# Patient Record
Sex: Female | Born: 1990 | Race: White | Hispanic: No | Marital: Single
Health system: Midwestern US, Academic
[De-identification: ages and names within clinical notes are randomized; demographics above are authoritative.]

---

## 2017-05-10 ENCOUNTER — Encounter: Admit: 2017-05-10 | Discharge: 2017-05-11 | Payer: MEDICARE | Primary: Family

## 2017-05-10 DIAGNOSIS — R69 Illness, unspecified: Principal | ICD-10-CM

## 2017-05-31 ENCOUNTER — Encounter: Admit: 2017-05-31 | Discharge: 2017-05-31 | Payer: MEDICARE | Primary: Family

## 2017-06-01 MED ORDER — TIZANIDINE 4 MG PO TAB
4 mg | ORAL_TABLET | Freq: Every evening | ORAL | 0 refills | Status: AC | PRN
Start: 2017-06-01 — End: 2017-06-06

## 2017-06-01 NOTE — Telephone Encounter
Refill request for Tizanidine 4 mg po qhs prn   Date of last refill per pharmacy  02/2017.   Last office visit 06/01/2017.   Follow up appointment 06/01/2017.    Refill per physician approval.    Patient will need to make/keep appointment prior to any further refills. Otherwise, will need to transfer rx to another provider.

## 2017-06-06 ENCOUNTER — Encounter: Admit: 2017-06-06 | Discharge: 2017-06-06 | Payer: MEDICARE | Primary: Family

## 2017-06-06 ENCOUNTER — Ambulatory Visit: Admit: 2017-06-06 | Discharge: 2017-06-07 | Primary: Family

## 2017-06-06 DIAGNOSIS — F909 Attention-deficit hyperactivity disorder, unspecified type: Principal | ICD-10-CM

## 2017-06-06 DIAGNOSIS — G47411 Narcolepsy with cataplexy: ICD-10-CM

## 2017-06-06 DIAGNOSIS — G9389 Other specified disorders of brain: ICD-10-CM

## 2017-06-06 DIAGNOSIS — K219 Gastro-esophageal reflux disease without esophagitis: ICD-10-CM

## 2017-06-06 DIAGNOSIS — F419 Anxiety disorder, unspecified: ICD-10-CM

## 2017-06-06 DIAGNOSIS — E039 Hypothyroidism, unspecified: ICD-10-CM

## 2017-06-06 DIAGNOSIS — F319 Bipolar disorder, unspecified: ICD-10-CM

## 2017-06-06 DIAGNOSIS — E282 Polycystic ovarian syndrome: ICD-10-CM

## 2017-06-06 DIAGNOSIS — E079 Disorder of thyroid, unspecified: ICD-10-CM

## 2017-06-06 DIAGNOSIS — R9089 Other abnormal findings on diagnostic imaging of central nervous system: Principal | ICD-10-CM

## 2017-06-06 DIAGNOSIS — G500 Trigeminal neuralgia: ICD-10-CM

## 2017-06-06 DIAGNOSIS — J45909 Unspecified asthma, uncomplicated: ICD-10-CM

## 2017-06-06 DIAGNOSIS — F329 Major depressive disorder, single episode, unspecified: ICD-10-CM

## 2017-06-06 NOTE — Progress Notes
Date of Service: 06/06/2017    Subjective:             Julie Bright is a 26 y.o. female.    History of Present Illness    Julie Bright is referred for evaluation of a mass involving the left clinoid process.    She has a hisory significant for Anxiety Disorder, Migraines, Depression, Fibromyalgia, Trigeminal neuralgia, ADHD and Borderline Personality Disorder.???  Patient reports that she began experiencing localized left hand tremors beginning October 2017 which have progressively worsened to full body tremors including all 4 extremities since April 2018. Patient's mother reports that during initial work-up for the full body tremors it was believed that the tremors were due to a toxic level of Tegatrol (previously prescribed for trigeminal neuralgia). The patient's Tegatrol was discontinued and her full body tremor persisted - which is the reason for obtaining the MRI of the brain. The patient also reports increasing frequency and intensity of migraines that are constant, which she rates 10/10 pain. Patient reports taking Tylenol and Dilaudid for her headaches without any relief. Patient reports migraines associated with photophobia, phonophobia, nausea/emesis.   ???  Patient reports going to the ED 06/01/2017 due to intractable migraines and associated left-sided facial droop. The patient's mother reports that the left-sided facial droop resolved with administration of pain medication and resolution of migraine. Patient denies any prior episodes of weakness associated with migraines. Patient has also reported progressive worsening of memory since October 2017.   ???  The MRI was done in OSH. The report mentions a mass involving the clinoid process consistent with a meningioma.  I personally reviewed the images. I disagree with the radiology report. The right clinoid process appears well aerated, the left one has fatty bony marrow. Both appears as normal variants to me.         Review of Systems Constitutional: Positive for fatigue.        Daytime somnolence   Eyes: Positive for visual disturbance.   Respiratory: Positive for shortness of breath.    Musculoskeletal: Positive for arthralgias, back pain, gait problem, myalgias and neck pain.   Allergic/Immunologic: Positive for environmental allergies.   Neurological: Positive for dizziness, numbness and headaches.        Memory problems     Psychiatric/Behavioral: Positive for decreased concentration, dysphoric mood and sleep disturbance. The patient is nervous/anxious.    All other systems reviewed and are negative.        Objective:         ??? albuterol (PROAIR HFA) 90 mcg/actuation inhaler Inhale 2 Puffs by mouth into the lungs four times daily as needed for Wheezing or Shortness of Breath. Shake well before use.   ??? carboxymethylcellulose sodium (REFRESH LIQUIGEL OP) Place 1-2 drops into or around eye(s) at bedtime as needed.   ??? cyanocobalamin (VITAMIN B-12, RUBRAMIN) 1,000 mcg/mL injection Inject 1 mL into the muscle every 30 days.   ??? desloratadine(+) (CLARINEX) 5 mg tablet Take 5 mg by mouth daily.   ??? ergocalciferol (VITAMIN D-2) 50,000 unit capsule Take 1 capsule by mouth every 7 days.   ??? fluticasone (FLONASE) 50 mcg/actuation nasal spray Apply  to each nostril as directed daily. Shake bottle gently before using.   ??? hydrOXYzine pamoate (VISTARIL) 50 mg capsule Take 50 mg by mouth three times daily as needed for Itching.   ??? levothyroxine (SYNTHROID) 88 mcg tablet Take 88 mcg by mouth daily.   ??? meclizine (ANTIVERT) 25 mg tablet Take 25 mg by  mouth three times daily as needed.   ??? medroxyprogesterone (DEPO-PROVERA) 400 mg/mL injection Inject 400 mg into the muscle every 90 days.   ??? pregabalin (LYRICA) 75 mg capsule 75mg  po qam and 150mg  po qhs  Indications: FIBROMYALGIA   ??? tiotropium bromide (SPIRIVA RESPIMAT) 2.5 mcg/actuation inhaler Inhale 2 puffs by mouth into the lungs daily.         Physical Exam Constitutional: She is oriented to person, place, and time. She appears well-developed and well-nourished.   HENT:   Head: Normocephalic and atraumatic.   Eyes: Conjunctivae and EOM are normal. Pupils are equal, round, and reactive to light.   Neck: Neck supple.   Cardiovascular: Normal rate.    Pulmonary/Chest: Effort normal. No respiratory distress.   Abdominal: Soft. She exhibits no distension.   Neurological: She is alert and oriented to person, place, and time. She has normal strength. No cranial nerve deficit. Gait normal.   Skin: Skin is warm and dry.   Psychiatric: She has a normal mood and affect.            Assessment and Plan:    Julie Bright is referred for evaluation of a mass involving the left clinoid process.  The MRI was done in OSH. The report mentions a mass involving the clinoid process consistent with a meningioma.  I personally reviewed the images. I disagree with the radiology report. The right clinoid process appears well aerated, the left one has fatty bony marrow. Both appears as normal variants to me.  In order to confirm whether she has a tumor or not, I will order a thin-cut CT scan and MRI (skull base protocol).  I will see her back after that to discuss the results.    Regarding her symptoms, I clearly explained to her that the symptoms are not related to this presumed mass in any shape or form. She should follow-up with her psychiatrist to help with the symptoms.

## 2017-06-07 ENCOUNTER — Encounter: Admit: 2017-06-07 | Discharge: 2017-06-07 | Payer: MEDICARE | Primary: Family

## 2017-06-07 DIAGNOSIS — R69 Illness, unspecified: Principal | ICD-10-CM

## 2017-06-07 NOTE — Telephone Encounter
Pt called stating Dr. Francis Gaines ordered a MRI for her. She is claustrophobic and is requesting RX to help during exam.

## 2017-06-08 MED ORDER — DIAZEPAM 5 MG PO TAB
ORAL_TABLET | ORAL | 0 refills | 7.00000 days | Status: AC
Start: 2017-06-08 — End: 2019-01-16

## 2017-06-19 ENCOUNTER — Encounter: Admit: 2017-06-19 | Discharge: 2017-06-19 | Payer: MEDICARE | Primary: Family

## 2017-06-20 MED ORDER — TIZANIDINE 4 MG PO TAB
ORAL_TABLET | Freq: Every evening | 0 refills | PRN
Start: 2017-06-20 — End: ?

## 2017-06-23 ENCOUNTER — Ambulatory Visit: Admit: 2017-06-23 | Discharge: 2017-06-24 | Primary: Family

## 2017-06-23 ENCOUNTER — Encounter: Admit: 2017-06-23 | Discharge: 2017-06-23 | Payer: MEDICARE | Primary: Family

## 2017-06-23 ENCOUNTER — Ambulatory Visit: Admit: 2017-06-23 | Discharge: 2017-06-23 | Primary: Family

## 2017-06-23 ENCOUNTER — Ambulatory Visit: Admit: 2017-06-23 | Discharge: 2017-06-24 | Payer: MEDICARE | Primary: Family

## 2017-06-23 DIAGNOSIS — E282 Polycystic ovarian syndrome: ICD-10-CM

## 2017-06-23 DIAGNOSIS — R9089 Other abnormal findings on diagnostic imaging of central nervous system: Principal | ICD-10-CM

## 2017-06-23 DIAGNOSIS — G500 Trigeminal neuralgia: ICD-10-CM

## 2017-06-23 DIAGNOSIS — F419 Anxiety disorder, unspecified: ICD-10-CM

## 2017-06-23 DIAGNOSIS — G47411 Narcolepsy with cataplexy: ICD-10-CM

## 2017-06-23 DIAGNOSIS — F909 Attention-deficit hyperactivity disorder, unspecified type: Principal | ICD-10-CM

## 2017-06-23 DIAGNOSIS — E039 Hypothyroidism, unspecified: ICD-10-CM

## 2017-06-23 DIAGNOSIS — K219 Gastro-esophageal reflux disease without esophagitis: ICD-10-CM

## 2017-06-23 DIAGNOSIS — F329 Major depressive disorder, single episode, unspecified: ICD-10-CM

## 2017-06-23 DIAGNOSIS — J45909 Unspecified asthma, uncomplicated: ICD-10-CM

## 2017-06-23 DIAGNOSIS — E079 Disorder of thyroid, unspecified: ICD-10-CM

## 2017-06-23 DIAGNOSIS — G9389 Other specified disorders of brain: Principal | ICD-10-CM

## 2017-06-23 DIAGNOSIS — F319 Bipolar disorder, unspecified: ICD-10-CM

## 2017-06-23 NOTE — Progress Notes
Julie Bright returns to the clinic for a FUV.   An MRI and CT of the head was done this morning for evaluation of a suspected mass involving the left clinoid process.   MRI was an incomplete study because of anxiety Jesus could not complete the exam.   However several sequences of the MRI were performed.    I reviewed the images with our neuroradiologist.  We reviewed the previous MRI (done outside), current CT scan and current MRI.  We both agree that there is no tumor.  The findings are just normal variant of pneumatized clinoid process of the right and a fatty clinoid on the left.    Plan:  She needs to continue to follow-up with neurology and psychiatry for her symptoms

## 2018-11-13 LAB — COMPREHENSIVE METABOLIC PANEL
Lab: 117
Lab: 138
Lab: 4
Lab: 54 — ABNORMAL HIGH (ref 7–39)
Lab: 93 — ABNORMAL HIGH (ref 14–76)
Lab: >60
Lab: >60

## 2018-11-13 LAB — LIPID PROFILE
Lab: 19 — ABNORMAL LOW (ref 60–99)
Lab: 2.6 — ABNORMAL LOW (ref 3.4–5.0)
Lab: 21 — ABNORMAL LOW (ref 40–60)
Lab: 33 — ABNORMAL LOW (ref 90–129)
Lab: 54 — ABNORMAL LOW (ref 100–200)
Lab: 71

## 2018-11-13 LAB — THYROID STIMULATING HORMONE-TSH: Lab: 6.5 — ABNORMAL HIGH (ref 0.354–5.720)

## 2018-11-13 LAB — 25-OH VITAMIN D (D2 + D3): Lab: 26 — ABNORMAL LOW (ref 30.0–100.0)

## 2018-11-13 LAB — LIPASE: Lab: 83 — ABNORMAL LOW (ref 3.5–5.0)

## 2018-11-13 LAB — CBC: Lab: 4.5

## 2018-12-21 LAB — BASIC METABOLIC PANEL
Lab: 0.9
Lab: 109 — ABNORMAL HIGH (ref 98–107)
Lab: 140
Lab: 22
Lab: 3.8
Lab: 7.7 — ABNORMAL LOW (ref 8.3–10.6)
Lab: 9
Lab: 90
Lab: <8 — ABNORMAL LOW (ref 9–23)
Lab: >60
Lab: >60

## 2019-01-01 ENCOUNTER — Encounter: Admit: 2019-01-01 | Discharge: 2019-01-01 | Payer: MEDICARE | Primary: Family

## 2019-01-01 DIAGNOSIS — G500 Trigeminal neuralgia: ICD-10-CM

## 2019-01-01 DIAGNOSIS — F909 Attention-deficit hyperactivity disorder, unspecified type: Principal | ICD-10-CM

## 2019-01-01 DIAGNOSIS — E079 Disorder of thyroid, unspecified: ICD-10-CM

## 2019-01-01 DIAGNOSIS — G47411 Narcolepsy with cataplexy: ICD-10-CM

## 2019-01-01 DIAGNOSIS — F329 Major depressive disorder, single episode, unspecified: ICD-10-CM

## 2019-01-01 DIAGNOSIS — F419 Anxiety disorder, unspecified: ICD-10-CM

## 2019-01-01 DIAGNOSIS — E282 Polycystic ovarian syndrome: ICD-10-CM

## 2019-01-01 DIAGNOSIS — F319 Bipolar disorder, unspecified: ICD-10-CM

## 2019-01-01 DIAGNOSIS — J45909 Unspecified asthma, uncomplicated: ICD-10-CM

## 2019-01-01 DIAGNOSIS — K219 Gastro-esophageal reflux disease without esophagitis: ICD-10-CM

## 2019-01-01 DIAGNOSIS — E039 Hypothyroidism, unspecified: ICD-10-CM

## 2019-01-01 LAB — COMPREHENSIVE METABOLIC PANEL
Lab: 1
Lab: 1.5 — ABNORMAL HIGH (ref 0.2–1.2)
Lab: 108 — ABNORMAL HIGH (ref 98–107)
Lab: 127
Lab: 138
Lab: 2.6 — ABNORMAL LOW (ref 3.5–5.0)
Lab: 20 — ABNORMAL LOW (ref 22–29)
Lab: 3.5 — ABNORMAL LOW (ref 12.0–16.0)
Lab: 33
Lab: 5.5 — ABNORMAL LOW (ref 6.4–8.3)
Lab: 8 — ABNORMAL LOW (ref 27.0–31.0)
Lab: 81

## 2019-01-01 LAB — CBC: Lab: 5.4

## 2019-01-01 NOTE — Progress Notes
Records Request    Medical records request for continuation of care:    Patient has appointment on 01/16/2019   with  Dr. Nickolas Madrid* .    Please fax records to [facility]  [phone]    Request records:      Primary Care Office Note (Most recent)    Most recent Emergency Room Visit/ H&P/ Discharge Summary    EEG    EKG's           Any Cardiac Testing    Any cardiac-related records    Recent Labs        Thank you,      [facility]  [facility]  [address]  [place], [place] [address]  Phone:  [phone]  Fax:  [phone]

## 2019-01-02 ENCOUNTER — Encounter: Admit: 2019-01-02 | Discharge: 2019-01-02 | Payer: MEDICARE | Primary: Family

## 2019-01-03 ENCOUNTER — Encounter: Admit: 2019-01-03 | Discharge: 2019-01-03 | Payer: MEDICARE | Primary: Family

## 2019-01-03 DIAGNOSIS — K219 Gastro-esophageal reflux disease without esophagitis: ICD-10-CM

## 2019-01-03 DIAGNOSIS — J45909 Unspecified asthma, uncomplicated: ICD-10-CM

## 2019-01-03 DIAGNOSIS — G47411 Narcolepsy with cataplexy: ICD-10-CM

## 2019-01-03 DIAGNOSIS — G500 Trigeminal neuralgia: ICD-10-CM

## 2019-01-03 DIAGNOSIS — F319 Bipolar disorder, unspecified: ICD-10-CM

## 2019-01-03 DIAGNOSIS — F329 Major depressive disorder, single episode, unspecified: ICD-10-CM

## 2019-01-03 DIAGNOSIS — R079 Chest pain, unspecified: ICD-10-CM

## 2019-01-03 DIAGNOSIS — E079 Disorder of thyroid, unspecified: ICD-10-CM

## 2019-01-03 DIAGNOSIS — F419 Anxiety disorder, unspecified: ICD-10-CM

## 2019-01-03 DIAGNOSIS — E282 Polycystic ovarian syndrome: ICD-10-CM

## 2019-01-03 DIAGNOSIS — E669 Obesity, unspecified: ICD-10-CM

## 2019-01-03 DIAGNOSIS — F909 Attention-deficit hyperactivity disorder, unspecified type: ICD-10-CM

## 2019-01-03 DIAGNOSIS — E039 Hypothyroidism, unspecified: ICD-10-CM

## 2019-01-10 ENCOUNTER — Encounter: Admit: 2019-01-10 | Discharge: 2019-01-10 | Payer: MEDICARE | Primary: Family

## 2019-01-12 ENCOUNTER — Encounter: Admit: 2019-01-12 | Discharge: 2019-01-12 | Payer: MEDICARE | Primary: Family

## 2019-01-15 ENCOUNTER — Encounter: Admit: 2019-01-15 | Discharge: 2019-01-15 | Payer: MEDICARE | Primary: Family

## 2019-01-15 NOTE — Telephone Encounter
Contacted patient by phone 01/15/2019. Spoke with patient's mom, Misty. Patient has received MyChart email with zoom link for patient's appointment with Dr. Nickolas Madrid on 01/16/2019. Patient has tested zoom link and it was successful.

## 2019-01-16 ENCOUNTER — Encounter: Admit: 2019-01-16 | Discharge: 2019-01-16 | Payer: MEDICARE | Primary: Family

## 2019-01-16 ENCOUNTER — Ambulatory Visit: Admit: 2019-01-16 | Discharge: 2019-01-17 | Payer: MEDICARE | Primary: Family

## 2019-01-16 DIAGNOSIS — E282 Polycystic ovarian syndrome: ICD-10-CM

## 2019-01-16 DIAGNOSIS — G500 Trigeminal neuralgia: ICD-10-CM

## 2019-01-16 DIAGNOSIS — F329 Major depressive disorder, single episode, unspecified: ICD-10-CM

## 2019-01-16 DIAGNOSIS — F909 Attention-deficit hyperactivity disorder, unspecified type: ICD-10-CM

## 2019-01-16 DIAGNOSIS — G47411 Narcolepsy with cataplexy: ICD-10-CM

## 2019-01-16 DIAGNOSIS — J45909 Unspecified asthma, uncomplicated: ICD-10-CM

## 2019-01-16 DIAGNOSIS — E669 Obesity, unspecified: ICD-10-CM

## 2019-01-16 DIAGNOSIS — K219 Gastro-esophageal reflux disease without esophagitis: ICD-10-CM

## 2019-01-16 DIAGNOSIS — E039 Hypothyroidism, unspecified: ICD-10-CM

## 2019-01-16 DIAGNOSIS — R079 Chest pain, unspecified: ICD-10-CM

## 2019-01-16 DIAGNOSIS — R55 Syncope and collapse: ICD-10-CM

## 2019-01-16 DIAGNOSIS — F319 Bipolar disorder, unspecified: ICD-10-CM

## 2019-01-16 DIAGNOSIS — E079 Disorder of thyroid, unspecified: ICD-10-CM

## 2019-01-16 DIAGNOSIS — M797 Fibromyalgia: Principal | ICD-10-CM

## 2019-01-16 DIAGNOSIS — F419 Anxiety disorder, unspecified: ICD-10-CM

## 2019-01-16 NOTE — Progress Notes
???   Vasodepressor syncope        Assessment and Plan     In summary: This is a 28 year old white female that has a complex past medical history (please see above), she status post gastric bypass x2, also has a history of migraine headaches and psychiatric issues, patient presents with several episodes of vasovagal syncope (probably mainly vasodepressor), also hypotension the recent resulted in a brief loss of consciousness, this occurred after hitting her head against the door.    Plan:    1.  I asked the patient to minimize the intake of carbonated/soft drinks and I recommended that she should mainly drink water and to keep a good state of hydration.  2.  Also recommended to add a small amount of salt in her diet and I did recommend compression stockings.  As of now, I do not recommend adding any other medications such as midodrine or fludrocortisone.  3.  Patient will be further evaluated with a Holter monitor, 2D echo Doppler study and she will be  seen in the office by Dr. Doyce Loose to discuss further work-up of her syncopal episodes including tilt table test if considered necessary as well as medications.  Our office will be in touch with this patient in approximately 4 to 6 weeks to schedule this test as well as the office visit with Dr. Doyce Loose.         Current Medications (including today's revisions)  ??? albuterol (PROAIR HFA) 90 mcg/actuation inhaler Inhale 2 Puffs by mouth into the lungs four times daily as needed for Wheezing or Shortness of Breath. Shake well before use.   ??? carboxymethylcellulose sodium (REFRESH LIQUIGEL OP) Place 1-2 drops into or around eye(s) at bedtime as needed.   ??? cyanocobalamin (VITAMIN B-12, RUBRAMIN) 1,000 mcg/mL injection Inject 1 mL into the muscle every 30 days.   ??? desloratadine(+) (CLARINEX) 5 mg tablet Take 5 mg by mouth daily.   ??? diazePAM (VALIUM) 5 mg tablet Take 1 tab 1 hr before MRI.  Take 1 tab before MRI.  May repeat X1.

## 2019-01-19 ENCOUNTER — Encounter: Admit: 2019-01-19 | Discharge: 2019-01-19 | Payer: MEDICARE | Primary: Family

## 2019-02-02 ENCOUNTER — Encounter: Admit: 2019-02-02 | Discharge: 2019-02-02 | Payer: MEDICARE | Primary: Family

## 2019-02-05 ENCOUNTER — Encounter: Admit: 2019-02-05 | Discharge: 2019-02-05 | Payer: MEDICARE | Primary: Family

## 2019-02-05 NOTE — Telephone Encounter
02/05/19 patient agreeable to in person visit vs. telehealth with Dr. Milas Kocher at 10am on 5/4.  Will hold off on echo at this time as just had rotator cuff surgery and does not want to reinjure or halt process. Also mailed back holter monitor one week ago, awaiting results. Stephens November, RN

## 2019-02-06 ENCOUNTER — Encounter: Admit: 2019-02-06 | Discharge: 2019-02-06 | Payer: MEDICARE | Primary: Family

## 2019-02-08 ENCOUNTER — Encounter: Admit: 2019-02-08 | Discharge: 2019-02-08 | Payer: MEDICARE | Primary: Family

## 2019-02-09 ENCOUNTER — Encounter: Admit: 2019-02-09 | Discharge: 2019-02-09 | Payer: MEDICARE | Primary: Family

## 2019-02-12 ENCOUNTER — Encounter: Admit: 2019-02-12 | Discharge: 2019-02-12 | Payer: MEDICARE | Primary: Family

## 2019-02-12 ENCOUNTER — Ambulatory Visit: Admit: 2019-02-12 | Discharge: 2019-02-12 | Payer: MEDICARE | Primary: Family

## 2019-02-12 DIAGNOSIS — F329 Major depressive disorder, single episode, unspecified: ICD-10-CM

## 2019-02-12 DIAGNOSIS — F319 Bipolar disorder, unspecified: ICD-10-CM

## 2019-02-12 DIAGNOSIS — E079 Disorder of thyroid, unspecified: ICD-10-CM

## 2019-02-12 DIAGNOSIS — R079 Chest pain, unspecified: ICD-10-CM

## 2019-02-12 DIAGNOSIS — E669 Obesity, unspecified: ICD-10-CM

## 2019-02-12 DIAGNOSIS — G47411 Narcolepsy with cataplexy: ICD-10-CM

## 2019-02-12 DIAGNOSIS — J45909 Unspecified asthma, uncomplicated: ICD-10-CM

## 2019-02-12 DIAGNOSIS — R55 Syncope and collapse: Principal | ICD-10-CM

## 2019-02-12 DIAGNOSIS — E282 Polycystic ovarian syndrome: ICD-10-CM

## 2019-02-12 DIAGNOSIS — G500 Trigeminal neuralgia: ICD-10-CM

## 2019-02-12 DIAGNOSIS — F909 Attention-deficit hyperactivity disorder, unspecified type: Secondary | ICD-10-CM

## 2019-02-12 DIAGNOSIS — K219 Gastro-esophageal reflux disease without esophagitis: ICD-10-CM

## 2019-02-12 DIAGNOSIS — E039 Hypothyroidism, unspecified: ICD-10-CM

## 2019-02-12 DIAGNOSIS — I951 Orthostatic hypotension: ICD-10-CM

## 2019-02-12 DIAGNOSIS — F419 Anxiety disorder, unspecified: ICD-10-CM

## 2019-02-12 MED ORDER — MIDODRINE 5 MG PO TAB
5 mg | ORAL_TABLET | Freq: Three times a day (TID) | ORAL | 3 refills | Status: DC
Start: 2019-02-12 — End: 2019-06-22

## 2019-02-12 MED ORDER — MISCELLANEOUS MEDICAL SUPPLY MISC MISC
0 refills | 1.00000 days | Status: AC
Start: 2019-02-12 — End: ?

## 2019-03-14 ENCOUNTER — Encounter: Admit: 2019-03-14 | Discharge: 2019-03-14 | Primary: Family

## 2019-03-14 ENCOUNTER — Ambulatory Visit: Admit: 2019-03-14 | Discharge: 2019-03-15 | Primary: Family

## 2019-03-14 DIAGNOSIS — R079 Chest pain, unspecified: Secondary | ICD-10-CM

## 2019-03-14 DIAGNOSIS — M797 Fibromyalgia: Secondary | ICD-10-CM

## 2019-03-14 DIAGNOSIS — R55 Syncope and collapse: Secondary | ICD-10-CM

## 2019-03-14 MED ORDER — PERFLUTREN LIPID MICROSPHERES 1.1 MG/ML IV SUSP
1-20 mL | Freq: Once | INTRAVENOUS | 0 refills | Status: CP | PRN
Start: 2019-03-14 — End: ?

## 2019-03-16 ENCOUNTER — Encounter: Admit: 2019-03-16 | Discharge: 2019-03-16 | Primary: Family

## 2019-03-16 NOTE — Telephone Encounter
-----   Message from Vertell Novak, MD sent at 03/15/2019  6:13 PM CDT -----  Dear nurses,    Please call this patient and let her know that the echocardiogram did not show any abnormalities, the heart function is normal.Thank you  ----- Message -----  From: Vertell Novak, MD  Sent: 03/14/2019   4:54 PM CDT  To: Vertell Novak, MD

## 2019-03-16 NOTE — Telephone Encounter
03/16/2019 9:25 AM     I called Julie Bright and Lm with her results from Dr Virgina Organ. Gaynelle Cage, RN

## 2019-05-11 ENCOUNTER — Encounter: Admit: 2019-05-11 | Discharge: 2019-05-11 | Primary: Family

## 2019-06-22 ENCOUNTER — Encounter: Admit: 2019-06-22 | Discharge: 2019-06-22 | Primary: Family

## 2019-06-22 ENCOUNTER — Ambulatory Visit: Admit: 2019-06-22 | Discharge: 2019-06-23 | Primary: Family

## 2019-06-22 DIAGNOSIS — E079 Disorder of thyroid, unspecified: Secondary | ICD-10-CM

## 2019-06-22 DIAGNOSIS — E669 Obesity, unspecified: Secondary | ICD-10-CM

## 2019-06-22 DIAGNOSIS — E282 Polycystic ovarian syndrome: Secondary | ICD-10-CM

## 2019-06-22 DIAGNOSIS — F319 Bipolar disorder, unspecified: Secondary | ICD-10-CM

## 2019-06-22 DIAGNOSIS — E039 Hypothyroidism, unspecified: Secondary | ICD-10-CM

## 2019-06-22 DIAGNOSIS — F419 Anxiety disorder, unspecified: Secondary | ICD-10-CM

## 2019-06-22 DIAGNOSIS — R079 Chest pain, unspecified: Secondary | ICD-10-CM

## 2019-06-22 DIAGNOSIS — J45909 Unspecified asthma, uncomplicated: Secondary | ICD-10-CM

## 2019-06-22 DIAGNOSIS — K219 Gastro-esophageal reflux disease without esophagitis: Secondary | ICD-10-CM

## 2019-06-22 DIAGNOSIS — F909 Attention-deficit hyperactivity disorder, unspecified type: Secondary | ICD-10-CM

## 2019-06-22 DIAGNOSIS — G47411 Narcolepsy with cataplexy: Secondary | ICD-10-CM

## 2019-06-22 DIAGNOSIS — G500 Trigeminal neuralgia: Secondary | ICD-10-CM

## 2019-06-22 DIAGNOSIS — F329 Major depressive disorder, single episode, unspecified: Secondary | ICD-10-CM

## 2019-06-22 MED ORDER — MIDODRINE 10 MG PO TAB
10 mg | ORAL_TABLET | Freq: Three times a day (TID) | ORAL | 3 refills | Status: AC
Start: 2019-06-22 — End: ?

## 2019-06-22 NOTE — Progress Notes
Date of Service: 06/22/2019    Julie Bright is a 28 y.o. female.       HPI     Julie Bright is a 28 year old woman with syncope, following up in the [facility].  Initially, telehealth consultation on Feb 12, 2019.  She has mental health disease, anxiety, depression, eating disorder with binge eating, attention deficit disorder, chronic pain syndrome, severe asthma, PCOS, hypothyroidism, status post gastric bypass surgery with 100-pound weight loss and subsequent regain, 3 years ago second operation of duodenal switch, after which she lost 70 pounds.  She has episodes of transient loss of consciousness.  After she saw Dr. Avie Arenas a few months ago, she discontinued caffeine with symptom improvement.  Four months ago, we initiated her on midodrine 5 mg 3 times daily when upright during the daytime.  Her symptoms ameliorated for a couple of months, though over the last couple months she has had some recurrent episodic lightheadedness.  She feels nauseated and vision closing in and transiently can lose consciousness.  She had 1 momentary syncope a month ago when she fell in her shower.  She is on disability.  She tried an exercise program and has lost 50 pounds over the last 4 months.  She keeps herself well hydrated, consuming over 100 ounces of water, and consumes lots of salt in her food, including pickles.  She has recently received her infusion for anemia.    Vital signs were reviewed.  Constitutional:  She is obese, pleasant, in no acute distress.  HEENT:  Normocephalic.  Eyes:  Slight pallor.  Neck:  Difficult to evaluate, but no overt JVD.  Cardiovascular:  Normal rate, regular rhythm.  No rub, gallops, murmurs.  Lungs:  Clear.  Skin:  She has excoriated skin lesions over both shins.  Psych:  Slightly felt flat affect.     ECG in the office today shows sinus rhythm, 92 beats per minute, within normal limits.     Echocardiogram from March 14, 2019, shows normal left ventricular systolic function, ejection fraction 55%, normal diastolic function, no valvular abnormalities.     1. Likely neurocardiogenic syncope.  2. History of gastric bypass surgery and duodenal switch.  3. Obesity.  4. Mental health disease, on antipsychotic therapy.  5. Eating disorder with binge eating.  6. Depression and anxiety.  7. Attention deficit disorder.  8. Chronic pain/fibromyalgia.  9. Asthma.   10. Anemia.    1. I reassured her that her echocardiogram and Holter monitor were within normal limits.    2. Reiterated hydration and salt.  3. Reiterated exercise program.  We printed the [facility] modified [place] postural tachycardia syndrome exercise program for autonomic conditioning.  4. Increase midodrine 10 mg 3 times every 4-6 hours during the day when upright.  5. Monitor ambulatory blood pressure.  6. Follow up in 1 year.    ([id number])         Vitals:    06/22/19 8119 06/22/19 0931 06/22/19 0934   BP: 110/74 128/85 (!) 128/92   BP Source: Arm, Right Lower Arm, Right Lower Arm, Right Lower   Pulse: 95 (!) 124 106   SpO2: 97%     Weight: (!) 139.5 kg (307 lb 9.6 oz)     Height: 1.753 m (5' 9)     PainSc: Zero       Body mass index is 45.42 kg/m???.     Past Medical History  Patient Active Problem List  Diagnosis Date Noted   ??? Orthostatic hypotension 02/12/2019   ??? 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  Patient's triggered events did correlate with sinus rhythm and sinus tachycardia.  It was no evidence of supraventricular arrhythmia, also ventricular arrhythmias did not occur.  03/14/2019 - ECHO:  Technically difficult study; i.v. transpulmonary contrast was used to define the endocardial borders.  Normal left ventricular systolic function, estimated ejection fraction is 55 %.  Normal left ventricular diastolic function.  Right Ventricle: Normal size and ejection fraction. M-Mode TAPSE 1.90 cm (normal >1.7 cm).  There are no valvular abnormalities.  The pulmonary artery pressure could not be obtained     ??? Obesity 01/03/2019   ??? ADHD 01/03/2019   ??? Anxiety 01/03/2019   ??? Depression 01/03/2019   ??? Chest pain 01/03/2019   ??? Brain mass 06/06/2017   ??? Fibromyalgia 12/30/2015       Review of Systems   Constitution: Negative.   HENT: Negative.    Eyes: Negative.    Cardiovascular: Positive for syncope.   Respiratory: Negative.    Endocrine: Negative.    Hematologic/Lymphatic: Negative.    Skin: Negative.    Musculoskeletal: Negative.    Gastrointestinal: Negative.    Genitourinary: Negative.    Neurological: Positive for dizziness.   Psychiatric/Behavioral: Negative.    Allergic/Immunologic: Negative.    All other systems reviewed and are negative.      Laboratories  Lab Results   Component Value Date/Time    WBC 5.4 01/01/2019    HGB 11.7 (L) 01/01/2019    HGB 11.8 (L) 11/13/2018    HCT 37.6 01/01/2019    PLTCT 203 01/01/2019     Lab Results   Component Value Date/Time    NA 138 01/01/2019    K 3.5 01/01/2019    CA 8.1 (L) 01/01/2019    CO2 20.0 (L) 01/01/2019    GAP 14 01/01/2019    BUN 8.0 01/01/2019    CR 1.02 01/01/2019    CR 0.91 12/21/2018    GFR 68.6 01/01/2019    GFRAA >60 12/21/2018    GLU 81 01/01/2019    GLU 90 12/21/2018    ALBUMIN 2.6 (L) 01/01/2019    TOTPROT 5.5 (L) 01/01/2019    ALKPHOS 127 01/01/2019    AST 33 01/01/2019    ALT 33 01/01/2019    TOTBILI 1.50 (H) 01/01/2019     Lab Results   Component Value Date/Time    TSH 6.500 (H) 11/13/2018     Lab Results   Component Value Date/Time    CHOL 54 (L) 11/13/2018    TRIG 71 11/13/2018    HDL 21 (L) 11/13/2018    LDL 19 (L) 11/13/2018     No results found for: TNI, TNIPOC, BNP, BNPPOC  No results found for: INR, PTT    Cardiovascular studies  Lab Results   Component Value Date    ECHOEF 55 03/14/2019       SOCIAL HISTORY:  Julie Bright  reports that she quit smoking about 10 years ago. Her smoking use included cigarettes. She has never used smokeless tobacco. She reports current alcohol use. She reports that she does not use drugs.       FAMILY HISTORY:  Her family history includes Paroxysmal SVT in her mother.            Current Medications (including today's revisions)  ??? albuterol (PROAIR HFA) 90 mcg/actuation inhaler Inhale 2 Puffs  by mouth into the lungs four times daily as needed for Wheezing or Shortness of Breath. Shake well before use.   ??? calcium carbonate (TUMS PO) Take  by mouth.   ??? cariprazine (VRAYLAR) 1.5 mg cap Take 1 capsule by mouth twice daily.   ??? cyanocobalamin (VITAMIN B-12, RUBRAMIN) 1,000 mcg/mL injection Inject 1 mL into the muscle every 30 days.   ??? dextroamphetamine-amphetamine (ADDERALL) 10 mg tablet Take 10 mg by mouth three times daily   ??? divalproex (DEPAKOTE EC) 250 mg DR tablet Take 250 mg by mouth twice daily. Take with food.   ??? esomeprazole DR (NEXIUM) 40 mg capsule Take 20 mg by mouth as Needed. Take on an empty stomach at least 1 hour before or 2 hours after food.   ??? fluticasone propion-salmeteroL (ADVAIR HFA) 45-21 mcg/actuation inhaler Inhale 2 puffs by mouth into the lungs twice daily.   ??? HYDROmorphone (DILAUDID) 2 mg tablet Take 2 mg by mouth every 4 hours as needed for Pain   ??? hydrOXYzine pamoate (VISTARIL) 50 mg capsule Take 100 mg by mouth three times daily as needed for Itching.   ??? levocetirizine 5 mg tab Take 1 tablet by mouth twice daily.   ??? levothyroxine (SYNTHROID) 100 mcg tablet Take 100 mcg by mouth daily.   ??? midodrine (PROAMATINE) 5 mg tablet Take one tablet by mouth three times daily.   ??? Miscellaneous [employer] misc Compression stockings:  Waist-high 30-40 mm Hg compression ([employer] / [employer]).  Wear when upright   ??? pregabalin (LYRICA) 75 mg capsule 75mg  po qam and 150mg  po qhs  Indications: FIBROMYALGIA (Patient taking differently: Take 300 mg by mouth three times daily. 75mg  po qam and 150mg  po qhs  Indications: disorder characterized by stiff, tender & painful muscles) ??? tiotropium bromide (SPIRIVA RESPIMAT) 2.5 mcg/actuation inhaler Inhale 2 puffs by mouth into the lungs daily.   ??? tiZANidine (ZANAFLEX) 4 mg tablet Take 4 mg by mouth every 6 hours as needed.   ??? vitamin E (AQUASOL E) 50 unit/mL drop oral solution Take  by mouth daily.

## 2019-06-23 DIAGNOSIS — R55 Syncope and collapse: Principal | ICD-10-CM

## 2019-08-21 IMAGING — CR CHEST
2 series · 2 of 2 positions shown · non-contrast
Comparison: none

[chest pa x-wise]
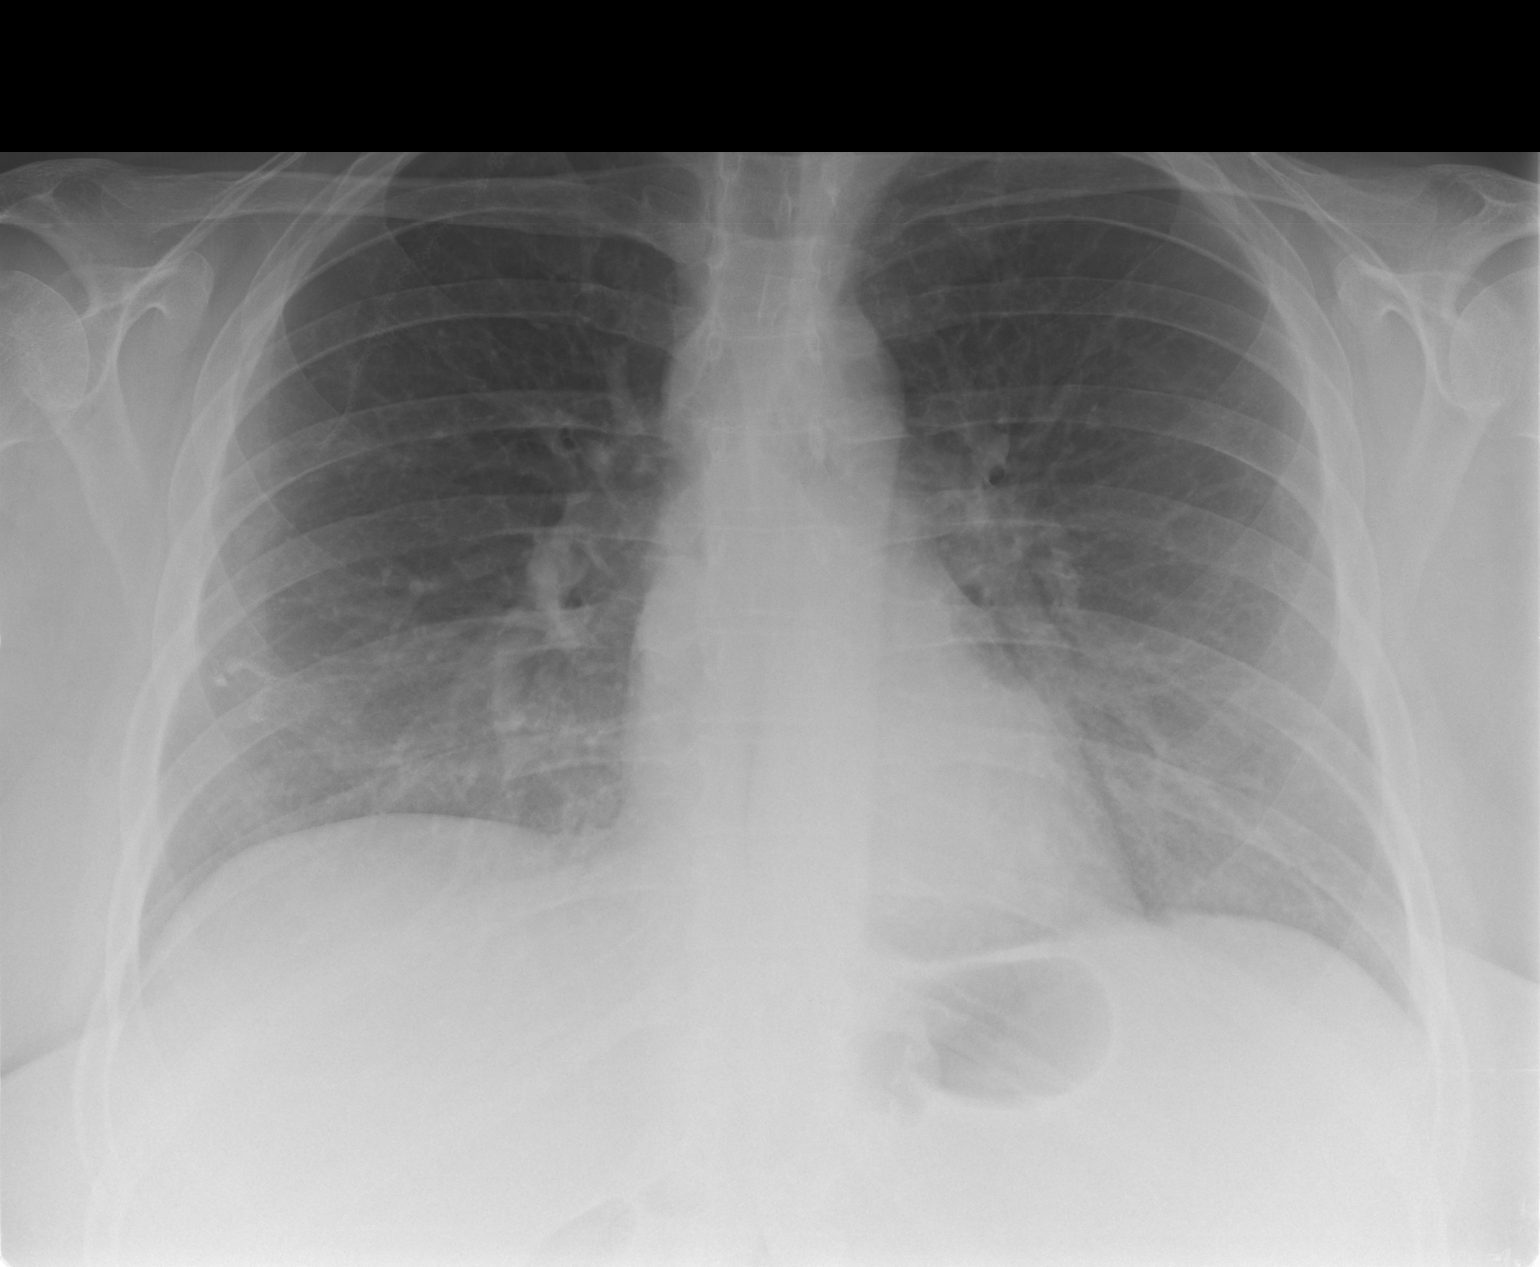

[chest lat]
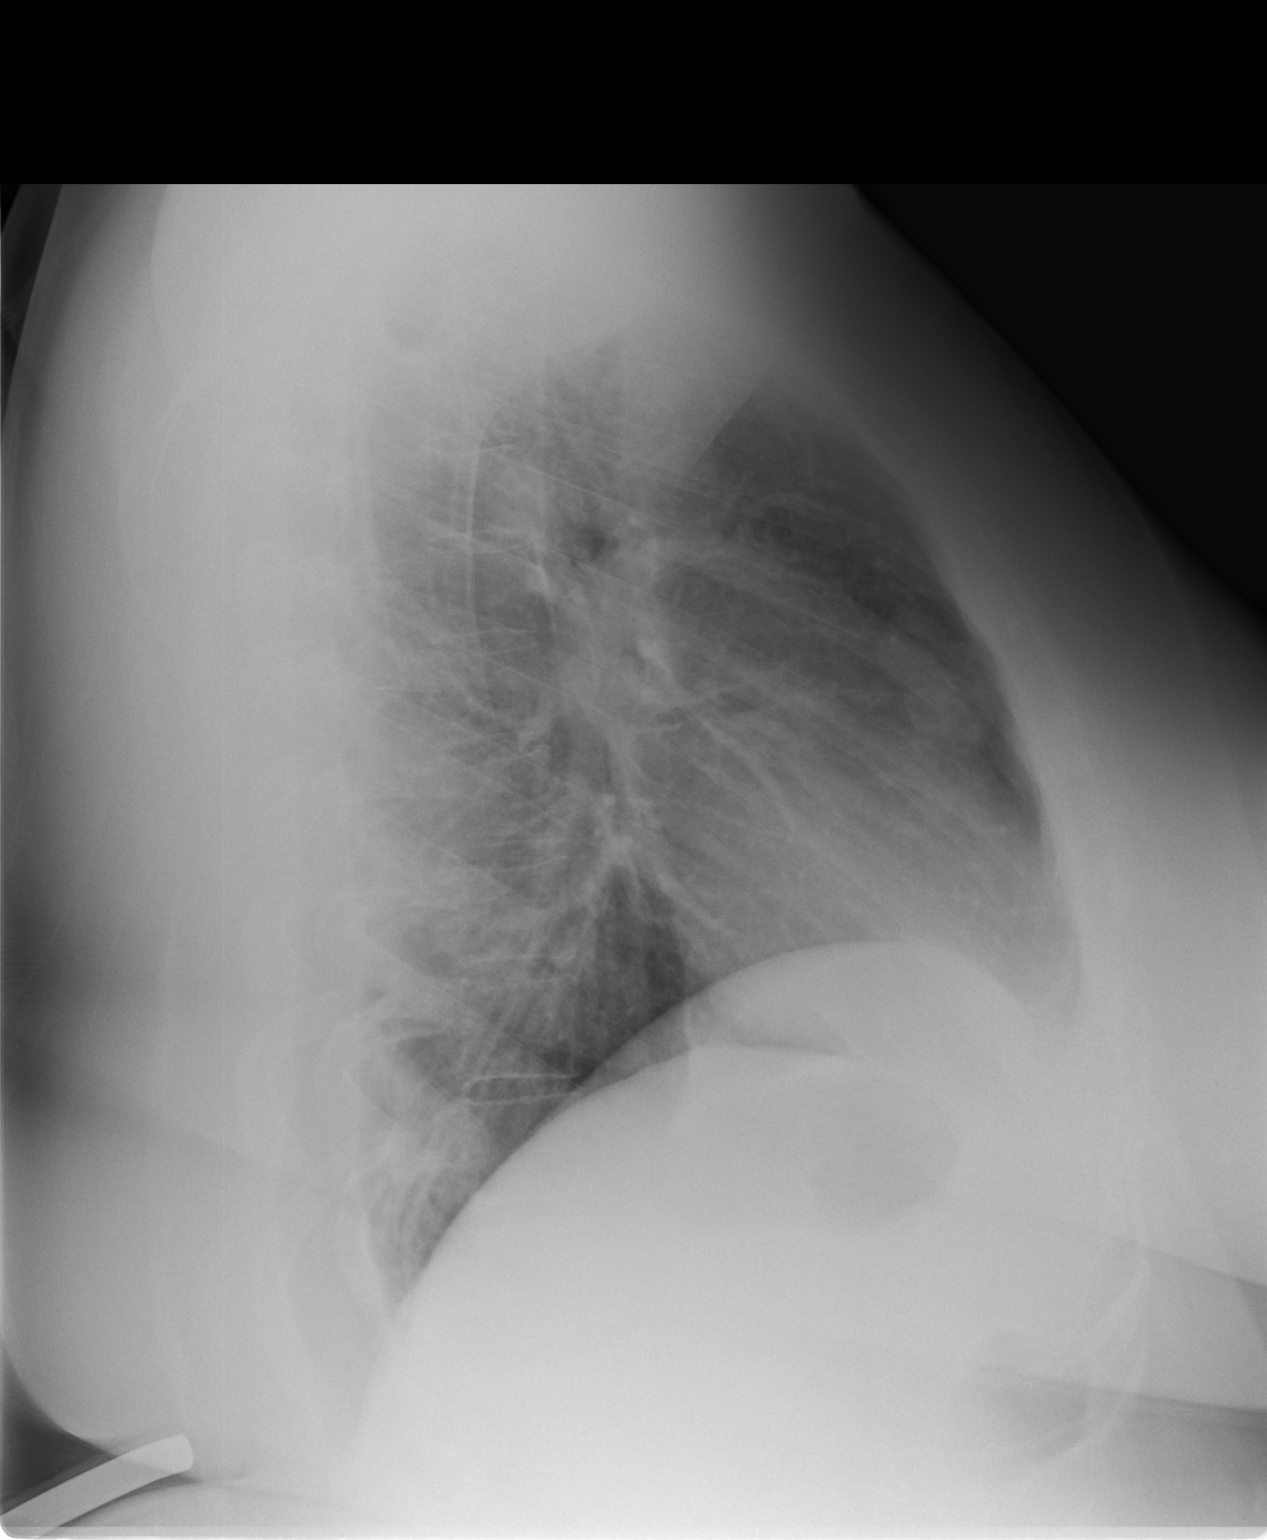

[2 of 2 positions shown; findings below may reference images not displayed]

10/20/18

EXAM

Chest x-ray.

INDICATION

Cough and wheezing; h/o granulomas in lung
cough, soa and fever. h/o granulomas in lungs. denies pregnancy. Pt is on depo shot. shielded.
me/tm

FINDINGS

Two views of the chest were obtained.

The lungs are clear.  The heart size and pulmonary vascularity are normal.

There are no acute bone lesions.

There are radiopaque sutures within the right lung.

IMPRESSION

There is no acute appearing radiographic abnormality of the chest.  There are postop changes of the
right lung.

Tech Notes:

cough, soa and fever. h/o granulomas in lungs. denies pregnancy. Pt is on depo shot. shielded. me/tm

## 2020-05-02 ENCOUNTER — Encounter: Admit: 2020-05-02 | Discharge: 2020-05-02 | Payer: MEDICARE | Primary: Family

## 2020-06-18 NOTE — Progress Notes
Name: Julie Bright          MRN: [id number]      DOB: 1991-08-25      AGE: 29 y.o.   DATE OF SERVICE: 06/20/2020    Subjective:             Reason for Visit:  Heme/Onc Care      Julie Bright is a 29 y.o. female.     Cancer Staging  No matching staging information was found for the patient.    History of Present Illness  This is a patient who has been referred for evaluation of iron deficiency anemia. ?Review of her records indicates that she had mild microcytic anemia in June 2018 with hemoglobin of 11.4. ?Subsequently in February 2020 her hemoglobin was noted to be 11.8 with MCV of 75. ?However more recently in May 2021 her hemoglobin was 13.3 with MCV of 88. ?Serum iron was 92 with TIBC of 350, iron saturation 26% and ferritin of 76.8. ?In August 2021 hemoglobin/hematocrit were 12.9/39, MCV 88, serum iron 83, TIBC 355, iron saturation 23% and ferritin of 40.2. ?B12 was mildly decreased at 343 and folate was low normal at 4.7. ?It appears that patient had been scheduled to receive Venofer in September, October and November 2020 as well as February 2021. ?The notes indicate that she could not complete these infusions due to IV infiltration. ?She apparently cannot tolerate oral iron as well.  She has been started on B12 injections weekly.    Past medical history: Asthma, migraines, GERD, psychogenic/nonepileptic seizures, anxiety/depression, hypothyroidism, neurocardiogenic syncope, bipolar disorder, fibromyalgia    Past surgical history: Roux-en-Y, duodenal switch, right wedge resection x2 with pleural biopsy.  Cholecystectomy, carpal tunnel release, knee arthroscopy, meniscectomy, septoplasty.    Family history: Mother has had diabetes and heart disease.  Maternal aunt had thyroid cancer and maternal grandfather had lung cancer.    Social history: Former smoker, infrequent alcohol. ?Currently on disability.          Review of Systems   Constitutional: Positive for activity change and fatigue.   HENT: Negative.    Eyes: Negative.    Respiratory: Negative.    Cardiovascular: Negative.    Gastrointestinal: Negative.    Genitourinary: Negative.    Musculoskeletal: Positive for back pain.   Skin: Negative.    Neurological: Positive for dizziness, syncope and light-headedness.   Psychiatric/Behavioral: Negative.          Objective:         ? albuterol (PROAIR HFA) 90 mcg/actuation inhaler Inhale 2 Puffs by mouth into the lungs four times daily as needed for Wheezing or Shortness of Breath. Shake well before use.   ? calcium carbonate (TUMS PO) Take  by mouth.   ? cariprazine (VRAYLAR) 1.5 mg cap Take 1 capsule by mouth twice daily.   ? cyanocobalamin (VITAMIN B-12, RUBRAMIN) 1,000 mcg/mL injection Inject 1 mL into the muscle every 30 days.   ? dextroamphetamine-amphetamine (ADDERALL) 10 mg tablet Take 10 mg by mouth three times daily   ? divalproex (DEPAKOTE EC) 250 mg DR tablet Take 250 mg by mouth twice daily. Take with food.   ? esomeprazole DR (NEXIUM) 40 mg capsule Take 20 mg by mouth as Needed. Take on an empty stomach at least 1 hour before or 2 hours after food.   ? fluticasone propion-salmeteroL (ADVAIR HFA) 45-21 mcg/actuation inhaler Inhale 2 puffs by mouth into the lungs twice daily.   ? HYDROmorphone (DILAUDID) 2 mg tablet Take  2 mg by mouth every 4 hours as needed for Pain   ? hydrOXYzine pamoate (VISTARIL) 50 mg capsule Take 100 mg by mouth three times daily as needed for Itching.   ? levocetirizine 5 mg tab Take 1 tablet by mouth twice daily.   ? levothyroxine (SYNTHROID) 100 mcg tablet Take 100 mcg by mouth daily.   ? midodrine (PROAMITINE) 10 mg tablet Take one tablet by mouth three times daily.   ? Miscellaneous Medical Supply misc Compression stockings:  Waist-high 30-40 mm Hg compression (Jobst / Therafirm).  Wear when upright   ? pregabalin (LYRICA) 75 mg capsule 75mg  po qam and 150mg  po qhs  Indications: FIBROMYALGIA (Patient taking differently: Take 300 mg by mouth three times daily. 75mg  po qam and 150mg  po qhs  Indications: disorder characterized by stiff, tender & painful muscles)   ? tiotropium bromide (SPIRIVA RESPIMAT) 2.5 mcg/actuation inhaler Inhale 2 puffs by mouth into the lungs daily.   ? tiZANidine (ZANAFLEX) 4 mg tablet Take 4 mg by mouth every 6 hours as needed.   ? vitamin E (AQUASOL E) 50 unit/mL drop oral solution Take  by mouth daily.     Vitals:    06/20/20 1444   BP: 114/79   BP Source: Arm, Right Lower   Patient Position: Sitting   Pulse: 88   Resp: 18   Temp: 36.3 ?C (97.3 ?F)   TempSrc: Temporal   SpO2: 100%   Weight: 131.6 kg (290 lb 3.2 oz)   Height: 175.3 cm (69)   PainSc: Zero     Body mass index is 42.86 kg/m?Marland Kitchen     Pain Score: Zero       Fatigue Scale: 8    Pain Addressed:  N/A    Patient Evaluated for a Clinical Trial: Patient not eligible for a treatment trial (including not needing treatment, needs palliative care, in remission).     [place] Cooperative Oncology Group performance status is 2, Ambulatory and capable of all selfcare but unable to carry out any work activities. Up and about more than 50% of waking hours.     Physical Exam  Constitutional:       Appearance: She is well-developed. She is obese.   HENT:      Head: Normocephalic and atraumatic.      Mouth/Throat:      Pharynx: No oropharyngeal exudate.   Eyes:      General: No scleral icterus.     Pupils: Pupils are equal, round, and reactive to light.   Neck:      Vascular: No JVD.   Cardiovascular:      Rate and Rhythm: Normal rate and regular rhythm.      Heart sounds: Normal heart sounds. No murmur heard.     Pulmonary:      Effort: Pulmonary effort is normal. No respiratory distress.      Breath sounds: Normal breath sounds. No rales.   Chest:      Chest wall: No tenderness.   Abdominal:      General: Bowel sounds are normal. There is no distension.      Palpations: Abdomen is soft. There is no mass.      Tenderness: There is no abdominal tenderness. There is no guarding.   Musculoskeletal:         General: No tenderness. Normal range of motion.      Cervical back: Normal range of motion.   Lymphadenopathy:      Cervical: No cervical adenopathy.  Skin:     Coloration: Skin is not pale.      Findings: No rash.      Nails: There is no clubbing.   Neurological:      Mental Status: She is alert and oriented to person, place, and time.               Assessment and Plan:  History of iron deficiency: Most likely related to bariatric surgery.  Patient currently has neither anemia nor iron deficiency to justify parenteral iron therapy.  In the future, if she does require iron infusions, would strongly consider PICC placement since she has difficulty completing iron infusions due to poor IV access.  Patient will see me back as needed.

## 2020-06-20 ENCOUNTER — Encounter: Admit: 2020-06-20 | Discharge: 2020-06-20 | Payer: Medicare Other | Primary: Family

## 2020-06-20 DIAGNOSIS — E282 Polycystic ovarian syndrome: Secondary | ICD-10-CM

## 2020-06-20 DIAGNOSIS — G47411 Narcolepsy with cataplexy: Secondary | ICD-10-CM

## 2020-06-20 DIAGNOSIS — G500 Trigeminal neuralgia: Secondary | ICD-10-CM

## 2020-06-20 DIAGNOSIS — Z862 Personal history of diseases of the blood and blood-forming organs and certain disorders involving the immune mechanism: Principal | ICD-10-CM

## 2020-06-20 DIAGNOSIS — K219 Gastro-esophageal reflux disease without esophagitis: Secondary | ICD-10-CM

## 2020-06-20 DIAGNOSIS — F909 Attention-deficit hyperactivity disorder, unspecified type: Principal | ICD-10-CM

## 2020-06-20 DIAGNOSIS — F329 Major depressive disorder, single episode, unspecified: Secondary | ICD-10-CM

## 2020-06-20 DIAGNOSIS — E669 Obesity, unspecified: Secondary | ICD-10-CM

## 2020-06-20 DIAGNOSIS — F319 Bipolar disorder, unspecified: Secondary | ICD-10-CM

## 2020-06-20 DIAGNOSIS — E079 Disorder of thyroid, unspecified: Secondary | ICD-10-CM

## 2020-06-20 DIAGNOSIS — F419 Anxiety disorder, unspecified: Secondary | ICD-10-CM

## 2020-06-20 DIAGNOSIS — J45909 Unspecified asthma, uncomplicated: Secondary | ICD-10-CM

## 2020-06-20 DIAGNOSIS — R079 Chest pain, unspecified: Secondary | ICD-10-CM

## 2020-06-20 DIAGNOSIS — E039 Hypothyroidism, unspecified: Secondary | ICD-10-CM

## 2020-06-27 ENCOUNTER — Encounter: Admit: 2020-06-27 | Discharge: 2020-06-27 | Payer: Medicare Other | Primary: Family

## 2020-06-27 MED ORDER — MIDODRINE 10 MG PO TAB
ORAL_TABLET | Freq: Three times a day (TID) | 3 refills | Status: AC
Start: 2020-06-27 — End: ?

## 2020-09-24 ENCOUNTER — Encounter: Admit: 2020-09-24 | Discharge: 2020-09-24 | Payer: Medicare Other | Primary: Family

## 2020-09-24 DIAGNOSIS — R55 Syncope and collapse: Secondary | ICD-10-CM

## 2020-09-24 DIAGNOSIS — F909 Attention-deficit hyperactivity disorder, unspecified type: Principal | ICD-10-CM

## 2020-09-24 DIAGNOSIS — E282 Polycystic ovarian syndrome: Secondary | ICD-10-CM

## 2020-09-24 DIAGNOSIS — G47411 Narcolepsy with cataplexy: Secondary | ICD-10-CM

## 2020-09-24 DIAGNOSIS — E039 Hypothyroidism, unspecified: Secondary | ICD-10-CM

## 2020-09-24 DIAGNOSIS — E079 Disorder of thyroid, unspecified: Secondary | ICD-10-CM

## 2020-09-24 DIAGNOSIS — G500 Trigeminal neuralgia: Secondary | ICD-10-CM

## 2020-09-24 DIAGNOSIS — F32A Depression: Secondary | ICD-10-CM

## 2020-09-24 DIAGNOSIS — F319 Bipolar disorder, unspecified: Secondary | ICD-10-CM

## 2020-09-24 DIAGNOSIS — E669 Obesity, unspecified: Secondary | ICD-10-CM

## 2020-09-24 DIAGNOSIS — K219 Gastro-esophageal reflux disease without esophagitis: Secondary | ICD-10-CM

## 2020-09-24 DIAGNOSIS — F419 Anxiety disorder, unspecified: Secondary | ICD-10-CM

## 2020-09-24 DIAGNOSIS — R079 Chest pain, unspecified: Secondary | ICD-10-CM

## 2020-09-24 DIAGNOSIS — J45909 Unspecified asthma, uncomplicated: Secondary | ICD-10-CM

## 2020-09-24 NOTE — Progress Notes
Date of Service: 09/24/2020    Julie Bright is a 29 y.o. female.       HPI     Julie Bright is a pleasant 29 year old woman for annual followup in [facility] in [place] office.  She had likely neurocardiogenic syncope and had orthostatic lightheadedness for which she is on midodrine.  Her mental health issues have been better controlled over the last 2 years.  She feels well.  She does exercise, going to the gym, spending an hour doing weights and other activity 3 times a week.  Otherwise, she is also up and about.  Her weight has been steady and she has lost approximately 10 pounds over the last year.  She has some daily headaches that she attributes to lack of sleep.  No syncope.  She has benefitted with increased midodrine dose over the last year.     Sitting comfortably, no distress.  Excess body mass index.  Lungs are clear.  Cardiac sounds are regular.  No murmurs.     ECG shows sinus rhythm 75 beats per minute, within normal limit.     Prior echocardiogram showed structurally normal heart.     1. Neurocardiogenic syncope.  2. Orthostatic lightheadedness.  3. History of gastric bypass surgery and duodenal switch.  4. Obesity.  5. Mental health disease on antipsychotic therapy.  6. Eating disorder.  7. Depression, anxiety.  8. Attention deficit disorder.  9. Chronic pain and fibromyalgia.  10. Asthma.  11. Anemia.     1. Continue regular exercise program.  2. Continue hydration.  3. Continue electrolyte supplementation.  4. Continue midodrine 10 mg by mouth 3 times a day when upright, avoid at bedtime.   5. Continue with further weight reduction.    She will have routine followup in 1 year.    ([id number])         Vitals:    09/24/20 1043   BP: 122/68   BP Source: Arm, Right Lower   Patient Position: Sitting   Pulse: 82   SpO2: 97%   Weight: 135.1 kg (297 lb 13.6 oz)   Height: 1.753 m (5' 9)   PainSc: Zero     Body mass index is 43.98 kg/m?Marland Kitchen     Past Medical History  Patient Active Problem List    Diagnosis Date Noted   ? Orthostatic hypotension 02/12/2019   ? 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  Patient's triggered events did correlate with sinus rhythm and sinus tachycardia.  It was no evidence of supraventricular arrhythmia, also ventricular arrhythmias did not occur.  03/14/2019 - ECHO:  Technically difficult study; i.v. transpulmonary contrast was used to define the endocardial borders.  Normal left ventricular systolic function, estimated ejection fraction is 55 %.  Normal left ventricular diastolic function.  Right Ventricle: Normal size and ejection fraction. M-Mode TAPSE 1.90 cm (normal >1.7 cm).  There are no valvular abnormalities.  The pulmonary artery pressure could not be obtained     ? Obesity 01/03/2019   ? ADHD 01/03/2019   ? Anxiety 01/03/2019   ? Depression 01/03/2019   ? Chest pain 01/03/2019   ? Brain mass 06/06/2017   ? Fibromyalgia 12/30/2015       Review of Systems   Constitutional: Negative.   HENT: Negative.    Eyes: Negative.    Cardiovascular: Negative.    Respiratory: Negative.    Endocrine: Negative.    Hematologic/Lymphatic: Negative.  Skin: Negative.    Musculoskeletal: Negative.    Gastrointestinal: Negative.    Genitourinary: Negative.    Neurological: Positive for dizziness and headaches.   Psychiatric/Behavioral: Negative.    Allergic/Immunologic: Negative.        Cardiovascular Health Factors  Vitals BP Readings from Last 3 Encounters:   09/24/20 122/68   06/20/20 114/79   06/22/19 (!) 128/92     Wt Readings from Last 3 Encounters:   09/24/20 135.1 kg (297 lb 13.6 oz)   06/20/20 131.6 kg (290 lb 3.2 oz)   06/22/19 (!) 139.5 kg (307 lb 9.6 oz)     BMI Readings from Last 3 Encounters:   09/24/20 43.98 kg/m?   06/20/20 42.86 kg/m?   06/22/19 45.42 kg/m?      Smoking Social History     Tobacco Use   Smoking Status Former Smoker   ? Types: Cigarettes   ? Quit date: 01/15/2009   ? Years since quitting: 11.6 Smokeless Tobacco Never Used      Lipid Profile Cholesterol   Date Value Ref Range Status   11/13/2018 54 (L) 100 - 200 Final     HDL   Date Value Ref Range Status   11/13/2018 21 (L) 40 - 60 Final     LDL   Date Value Ref Range Status   11/13/2018 19 (L) 60 - 99 Final     Triglycerides   Date Value Ref Range Status   11/13/2018 71  Final      Blood Sugar No results found for: HGBA1C  Glucose   Date Value Ref Range Status   01/01/2019 81  Final   12/21/2018 90  Final   11/13/2018 81  Final               Current Medications (including today's revisions)  ? albuterol (PROAIR HFA) 90 mcg/actuation inhaler Inhale 2 Puffs by mouth into the lungs four times daily as needed for Wheezing or Shortness of Breath. Shake well before use.   ? calcium carbonate (TUMS PO) Take  by mouth.   ? cariprazine (VRAYLAR) 1.5 mg cap Take 1 capsule by mouth twice daily.   ? cyanocobalamin (VITAMIN B-12, RUBRAMIN) 1,000 mcg/mL injection Inject 1 mL into the muscle every 30 days.   ? desogestrel-ethinyl estradiol (KARIVA) 0.15-0.02 mgx21 /0.01 mg x 5 tablet Take 1 tablet by mouth daily.   ? dextroamphetamine-amphetamine (ADDERALL) 10 mg tablet Take 10 mg by mouth three times daily   ? divalproex (DEPAKOTE EC) 250 mg DR tablet Take 250 mg by mouth twice daily. Take with food.   ? esomeprazole DR (NEXIUM) 40 mg capsule Take 20 mg by mouth as Needed. Take on an empty stomach at least 1 hour before or 2 hours after food.   ? fluticasone propion-salmeteroL (ADVAIR HFA) 45-21 mcg/actuation inhaler Inhale 2 puffs by mouth into the lungs twice daily.   ? HYDROmorphone (DILAUDID) 2 mg tablet Take 2 mg by mouth every 4 hours as needed for Pain   ? hydrOXYzine pamoate (VISTARIL) 50 mg capsule Take 100 mg by mouth three times daily as needed for Itching.   ? levocetirizine 5 mg tab Take 1 tablet by mouth twice daily.   ? levothyroxine (SYNTHROID) 100 mcg tablet Take 100 mcg by mouth daily.   ? midodrine (PROAMITINE) 10 mg tablet TAKE 1 TABLET BY MOUTH THREE TIMES A DAY   ? Miscellaneous Medical Supply misc Compression stockings:  Waist-high 30-40 mm Hg compression ([employer] / Therafirm).  Wear when upright   ? pregabalin (LYRICA) 75 mg capsule 75mg  po qam and 150mg  po qhs  Indications: FIBROMYALGIA (Patient taking differently: Take 300 mg by mouth three times daily. 75mg  po qam and 150mg  po qhs  Indications: disorder characterized by stiff, tender & painful muscles)   ? tiotropium bromide (SPIRIVA RESPIMAT) 2.5 mcg/actuation inhaler Inhale 2 puffs by mouth into the lungs daily.   ? tiZANidine (ZANAFLEX) 4 mg tablet Take 4 mg by mouth every 6 hours as needed.   ? vitamin E (AQUASOL E) 50 unit/mL drop oral solution Take  by mouth daily.

## 2020-09-24 NOTE — Patient Instructions
Please schedule an appointment with Dr. Noheria in one year.  To schedule an appointment call [phone].     In order to provide you the best care possible we ask that you follow up as below:    For non-urgent questions please contact us through your MyChart account.   For all medication refills please contact your pharmacy or send a request through MyChart.     For all questions that may need to be addressed urgently please call the nursing triage voicemail at [phone] Monday - Friday 8-5 only. Please leave a detailed message with your name, date of birth, and reason for your call.      Please allow ~ 10 business days for the results of any testing to be reviewed. Please call our office if you have not heard from a nurse within this time frame.

## 2021-04-24 ENCOUNTER — Encounter: Admit: 2021-04-24 | Discharge: 2021-04-24 | Payer: Medicare Other | Primary: Family

## 2021-04-24 MED ORDER — MIDODRINE 10 MG PO TAB
ORAL_TABLET | Freq: Three times a day (TID) | 1 refills | Status: AC
Start: 2021-04-24 — End: ?

## 2021-08-17 ENCOUNTER — Encounter: Admit: 2021-08-17 | Discharge: 2021-08-17 | Payer: Medicare Other | Primary: Family

## 2021-08-17 NOTE — Progress Notes
Date of Service: 08/17/21    Subjective:       Chief Complaint: No chief complaint on file.        Julie Bright is a 30 year old woman who is evaluated in follow-up regarding the above.  Her mother, Jewels Kresge, accompanied her for her initial consultation in August 2020 and helped relate the history.  In 2016, the patient began to experience burning quality pain in the left maxillary region which can last several hours in duration.    She has had episodes of tremulousness and slurred speech.  She underwent extensive work-up under the care of Dr. Ferne Reus fields, epileptologist at [facility].  Prior to that, she had been seen by Dr. Gerlean Ren.  Initial EEG was interpreted as markedly abnormal with frequent theta frequency slowing admixed delta and theta frequency slowing, most often over the left hemisphere laterally.  At times this appeared to be generalized with a bifrontal dominance.  Occasional focal sharp waves were noted bitemporally.  She was placed on levetiracetam, but developed dyspnea, so transition to lacosamide.  Her episodes continue to occur.  Lyrica which had been prescribed for fibromyalgia has been of some benefit.  She underwent CCTV-EEG monitoring in early 2019 with similar findings, but no clear seizures identified throughout the study.  It was felt that her episodes were likely nonepileptic.    She has had 1-2 migraines per month.  Triptans have been helpful in the past.    In the past, she has tried carbamazepine, gabapentin.  She has also been on Depakote 250 mg twice daily along with Lyrica 100 mg 3 times daily.  The latter 2 medications have helped her fibromyalgia and migraines.  More recently she has been on Lyrica 300 mg twice daily along with duloxetine 30 mg twice daily.  This is also worked quite nicely.    When I first met her in August 2020, records from [facility] were reviewed.  MRI brain from July 2018 demonstrated 1.5 x 1.4 x 0.6 cm homogeneously enhancing mass along the anterior glenoid process of the left sphenoid bone.  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  At the time of her initial consultation in August 2020 we discussed her psychogenic nonepileptic events in detail.  Since these mainly seem to be stress-induced, I encouraged her to work with a therapist using the [facility] therapist/patient workbooks to see if this was helpful.  We also talked about the possibly going to another comprehensive epilepsy center, but she and her mother had decided against this.  When I last saw her in February 2022, she was undergoing psychotherapy with Dr. Genelle Gather and reported fewer psychogenic nonepileptic events.  As long as she is undergoing psychotherapy, these episodes are markedly diminished.  Patient has also had some issues with stealing which is one of the reasons that she is now back working with Dr. Genelle Gather.      Medical History:   Diagnosis Date   ? Acid reflux    ? ADHD 01/03/2019   ? ADHD (attention deficit hyperactivity disorder)    ? Anxiety    ? Anxiety 01/03/2019   ? Asthma    ? Bipolar 1 disorder (HCC)    ? Chest pain 01/03/2019   ? Depression    ? Depression 01/03/2019   ? Hypothyroidism    ? Narcolepsy and cataplexy    ? Obesity 01/03/2019   ? PCOS (polycystic ovarian syndrome)    ? Thyroid disorder    ?  Trigeminal neuralgia of left side of face             Social Determinants of Health     Tobacco Use: Medium Risk   ? Smoking Tobacco Use: Former Smoker   ? Smokeless Tobacco Use: Never Used   Alcohol Use: Not on file   Financial Resource Strain: Not on file   Food Insecurity: Not on file   Transportation Needs: Not on file   Stress: Not on file   Social Connections: Not on file   Health Literacy: Not on file   Depression: Not at risk   ? PHQ-2 Score: 0   Housing Stability: Not on file         Family History   Problem Relation Age of Onset   ? Paroxysmal SVT Mother        Review of Systems   Constitutional: Positive for diaphoresis. Negative for chills, fever and unexpected weight change.   HENT: Negative for hearing loss, mouth sores and sore throat.    Eyes: Negative for visual disturbance.   Respiratory: Negative for choking and shortness of breath.    Cardiovascular: Negative for chest pain and palpitations.   Gastrointestinal: Negative for diarrhea, nausea and vomiting.   Endocrine: Positive for cold intolerance.   Genitourinary: Negative for dysuria, frequency and urgency.   Musculoskeletal: Negative for arthralgias and myalgias.   Skin: Negative for color change and rash.   Allergic/Immunologic: Positive for environmental allergies and food allergies.   Psychiatric/Behavioral: Negative for dysphoric mood and suicidal ideas. The patient is not nervous/anxious.          Objective:         Allergies   Allergen Reactions   ? Bactrim [Sulfamethoxazole-Trimethoprim] NAUSEA ONLY and RASH   ? Ofloxacin EDEMA   ? Sulfa (Sulfonamide Antibiotics) RASH     hives  hives     ? Bacitracin UNKNOWN   ? Lactase STOMACH UPSET   ? Lortab [Hydrocodone-Acetaminophen] UNKNOWN   ? Metaxalone NAUSEA ONLY   ? Neurontin [Gabapentin] UNKNOWN   ? Oxycodone UNKNOWN   ? Tramadol UNKNOWN       ? albuterol (PROAIR HFA) 90 mcg/actuation inhaler Inhale 2 Puffs by mouth into the lungs four times daily as needed for Wheezing or Shortness of Breath. Shake well before use.   ? calcium carbonate (TUMS PO) Take  by mouth.   ? cariprazine (VRAYLAR) 1.5 mg cap Take 1 capsule by mouth twice daily.   ? cyanocobalamin (VITAMIN B-12, RUBRAMIN) 1,000 mcg/mL injection Inject 1 mL into the muscle every 30 days.   ? dextroamphetamine-amphetamine (ADDERALL) 10 mg tablet Take 10 mg by mouth three times daily   ? divalproex (DEPAKOTE EC) 250 mg DR tablet Take 250 mg by mouth twice daily. Take with food.   ? esomeprazole DR (NEXIUM) 40 mg capsule Take 20 mg by mouth as Needed. Take on an empty stomach at least 1 hour before or 2 hours after food.   ? fluticasone propion-salmeteroL (ADVAIR HFA) 45-21 mcg/actuation inhaler Inhale 2 puffs by mouth into the lungs twice daily.   ? HYDROmorphone (DILAUDID) 2 mg tablet Take 2 mg by mouth every 4 hours as needed for Pain   ? hydrOXYzine pamoate (VISTARIL) 50 mg capsule Take 100 mg by mouth three times daily as needed for Itching.   ? levocetirizine 5 mg tab Take 1 tablet by mouth twice daily.   ? levothyroxine (SYNTHROID) 100 mcg tablet Take 100 mcg by mouth daily.   ?  midodrine (PROAMATINE) 10 mg tablet TAKE 1 TABLET BY MOUTH THREE TIMES A DAY   ? Miscellaneous Medical Supply misc Compression stockings:  Waist-high 30-40 mm Hg compression ([employer] / Therafirm).  Wear when upright   ? pregabalin (LYRICA) 75 mg capsule 75mg  po qam and 150mg  po qhs  Indications: FIBROMYALGIA (Patient taking differently: Take 300 mg by mouth three times daily. 75mg  po qam and 150mg  po qhs  Indications: disorder characterized by stiff, tender & painful muscles)   ? tiotropium bromide (SPIRIVA RESPIMAT) 2.5 mcg/actuation inhaler Inhale 2 puffs by mouth into the lungs daily.   ? tiZANidine (ZANAFLEX) 4 mg tablet Take 4 mg by mouth every 6 hours as needed.   ? vitamin E (AQUASOL E) 50 unit/mL drop oral solution Take  by mouth daily.     There were no vitals filed for this visit.  There is no height or weight on file to calculate BMI.     Physical Exam  Constitutional:       Appearance: Normal appearance.   HENT:      Head: Normocephalic and atraumatic.      Mouth/Throat:      Mouth: Mucous membranes are moist.   Eyes:      Extraocular Movements: Extraocular movements intact.      Pupils: Pupils are equal, round, and reactive to light.   Cardiovascular:      Rate and Rhythm: Normal rate and regular rhythm.   Pulmonary:      Effort: Pulmonary effort is normal. No respiratory distress.   Musculoskeletal:         General: No deformity. Normal range of motion.      Cervical back: Normal range of motion and neck supple.   Skin:     General: Skin is warm and dry.   Neurological:      Mental Status: She is alert.      Comments: Alert and oriented x4.  Recent and remote memory are intact.  Speech is normal.  Attention is excellent.  Cranial nerve examination reveals no facial asymmetry or decrease in facial sensation.  Eye findings as discussed above.  Tongue and palate movements are normal.  Neck and shoulder strength is good.  Motor examination is without drift.  Strength is excellent at 5/5 throughout both upper as well as lower extremities proximally distally.  Reflexes are symmetric at 1/4 throughout with flexor plantar responses bilaterally.  Sensory examination reveals intact light touch and pinprick sensation throughout.  Coordination is excellent without evidence for dysmetria or ataxia in either of the upper lower extremities.  The patient was able to stand and ambulate without difficulty.   Psychiatric:         Mood and Affect: Mood normal.         Behavior: Behavior normal.         Office Procedure:           Assessment and Plan:    1. Psychogenic nonepileptic seizure    2. Chronic migraine w/o aura w/o status migrainosus, not intractable    3. Fibromyalgia        Psychogenic nonepileptic events.  Fortunately these have diminished significantly with targeted cognitive behavioral therapy.    Chronic migraine headaches, overall diminished in frequency, but still significant.  We will try Maxalt to see if this is helpful.    Fibromyalgia, remains problematic, but improved.  Continue duloxetine and pregabalin.    I will see her again for follow-up in 6 months from  now.

## 2021-08-19 ENCOUNTER — Encounter: Admit: 2021-08-19 | Discharge: 2021-08-19 | Payer: Medicare Other | Primary: Family

## 2021-08-19 ENCOUNTER — Ambulatory Visit: Admit: 2021-08-19 | Discharge: 2021-08-19 | Payer: Medicare Other | Primary: Family

## 2021-08-19 DIAGNOSIS — F419 Anxiety disorder, unspecified: Secondary | ICD-10-CM

## 2021-08-19 DIAGNOSIS — R079 Chest pain, unspecified: Secondary | ICD-10-CM

## 2021-08-19 DIAGNOSIS — E282 Polycystic ovarian syndrome: Secondary | ICD-10-CM

## 2021-08-19 DIAGNOSIS — F909 Attention-deficit hyperactivity disorder, unspecified type: Secondary | ICD-10-CM

## 2021-08-19 DIAGNOSIS — J45909 Unspecified asthma, uncomplicated: Secondary | ICD-10-CM

## 2021-08-19 DIAGNOSIS — K219 Gastro-esophageal reflux disease without esophagitis: Secondary | ICD-10-CM

## 2021-08-19 DIAGNOSIS — F32A Depression: Secondary | ICD-10-CM

## 2021-08-19 DIAGNOSIS — E669 Obesity, unspecified: Secondary | ICD-10-CM

## 2021-08-19 DIAGNOSIS — G47411 Narcolepsy with cataplexy: Secondary | ICD-10-CM

## 2021-08-19 DIAGNOSIS — E079 Disorder of thyroid, unspecified: Secondary | ICD-10-CM

## 2021-08-19 DIAGNOSIS — E039 Hypothyroidism, unspecified: Secondary | ICD-10-CM

## 2021-08-19 DIAGNOSIS — F445 Conversion disorder with seizures or convulsions: Secondary | ICD-10-CM

## 2021-08-19 DIAGNOSIS — M797 Fibromyalgia: Secondary | ICD-10-CM

## 2021-08-19 DIAGNOSIS — F319 Bipolar disorder, unspecified: Secondary | ICD-10-CM

## 2021-08-19 DIAGNOSIS — G43709 Chronic migraine without aura, not intractable, without status migrainosus: Secondary | ICD-10-CM

## 2021-08-19 DIAGNOSIS — G500 Trigeminal neuralgia: Secondary | ICD-10-CM

## 2021-08-19 MED ORDER — DULOXETINE 30 MG PO CPDR
30 mg | ORAL_CAPSULE | Freq: Two times a day (BID) | ORAL | 3 refills | 60.00000 days | Status: AC
Start: 2021-08-19 — End: ?

## 2021-08-19 MED ORDER — PREGABALIN 300 MG PO CAP
300 mg | ORAL_CAPSULE | Freq: Two times a day (BID) | ORAL | 0 refills | Status: AC
Start: 2021-08-19 — End: ?

## 2021-08-19 MED ORDER — RIZATRIPTAN 10 MG PO TAB
ORAL_TABLET | 3 refills | Status: AC
Start: 2021-08-19 — End: ?

## 2021-08-25 ENCOUNTER — Encounter: Admit: 2021-08-25 | Discharge: 2021-08-25 | Payer: Medicare Other | Primary: Family

## 2021-09-01 ENCOUNTER — Encounter: Admit: 2021-09-01 | Discharge: 2021-09-01 | Payer: Medicare Other | Primary: Family

## 2021-09-07 ENCOUNTER — Encounter: Admit: 2021-09-07 | Discharge: 2021-09-07 | Payer: Medicare Other | Primary: Family

## 2021-09-07 ENCOUNTER — Ambulatory Visit: Admit: 2021-09-07 | Discharge: 2021-09-07 | Payer: Medicare Other | Primary: Family

## 2021-09-07 DIAGNOSIS — E079 Disorder of thyroid, unspecified: Secondary | ICD-10-CM

## 2021-09-07 DIAGNOSIS — G47411 Narcolepsy with cataplexy: Secondary | ICD-10-CM

## 2021-09-07 DIAGNOSIS — G90A POTS (postural orthostatic tachycardia syndrome): Secondary | ICD-10-CM

## 2021-09-07 DIAGNOSIS — E669 Obesity, unspecified: Secondary | ICD-10-CM

## 2021-09-07 DIAGNOSIS — F32A Depression: Secondary | ICD-10-CM

## 2021-09-07 DIAGNOSIS — F909 Attention-deficit hyperactivity disorder, unspecified type: Principal | ICD-10-CM

## 2021-09-07 DIAGNOSIS — G500 Trigeminal neuralgia: Secondary | ICD-10-CM

## 2021-09-07 DIAGNOSIS — J45909 Unspecified asthma, uncomplicated: Secondary | ICD-10-CM

## 2021-09-07 DIAGNOSIS — F319 Bipolar disorder, unspecified: Secondary | ICD-10-CM

## 2021-09-07 DIAGNOSIS — K219 Gastro-esophageal reflux disease without esophagitis: Secondary | ICD-10-CM

## 2021-09-07 DIAGNOSIS — R079 Chest pain, unspecified: Secondary | ICD-10-CM

## 2021-09-07 DIAGNOSIS — R0989 Other specified symptoms and signs involving the circulatory and respiratory systems: Secondary | ICD-10-CM

## 2021-09-07 DIAGNOSIS — R55 Syncope and collapse: Secondary | ICD-10-CM

## 2021-09-07 DIAGNOSIS — E282 Polycystic ovarian syndrome: Secondary | ICD-10-CM

## 2021-09-07 DIAGNOSIS — E039 Hypothyroidism, unspecified: Secondary | ICD-10-CM

## 2021-09-07 DIAGNOSIS — F419 Anxiety disorder, unspecified: Secondary | ICD-10-CM

## 2021-09-07 MED ORDER — MOUNJARO 10 MG/0.5 ML SC PNIJ
10 mg | SUBCUTANEOUS | 0 refills | Status: AC
Start: 2021-09-07 — End: ?

## 2021-09-07 MED ORDER — MOUNJARO 5 MG/0.5 ML SC PNIJ
5 mg | SUBCUTANEOUS | 0 refills | Status: AC
Start: 2021-09-07 — End: ?

## 2021-09-07 MED ORDER — MIDODRINE 10 MG PO TAB
10 mg | ORAL_TABLET | Freq: Three times a day (TID) | ORAL | 3 refills | Status: AC
Start: 2021-09-07 — End: ?

## 2021-09-07 MED ORDER — MOUNJARO 7.5 MG/0.5 ML SC PNIJ
7.5 mg | SUBCUTANEOUS | 0 refills | Status: AC
Start: 2021-09-07 — End: ?

## 2021-09-07 MED ORDER — MOUNJARO 15 MG/0.5 ML SC PNIJ
15 mg | SUBCUTANEOUS | 0 refills | Status: AC
Start: 2021-09-07 — End: ?

## 2021-09-07 MED ORDER — MOUNJARO 2.5 MG/0.5 ML SC PNIJ
2.5 mg | SUBCUTANEOUS | 0 refills | Status: AC
Start: 2021-09-07 — End: ?

## 2021-09-07 MED ORDER — MOUNJARO 12.5 MG/0.5 ML SC PNIJ
12.5 mg | SUBCUTANEOUS | 0 refills | Status: AC
Start: 2021-09-07 — End: ?

## 2021-09-10 ENCOUNTER — Encounter: Admit: 2021-09-10 | Discharge: 2021-09-10 | Payer: Medicare Other | Primary: Family

## 2021-09-16 NOTE — Progress Notes
Name: Julie Bright          MRN: [id number]      DOB: March 17, 1991      AGE: 30 y.o.   DATE OF SERVICE: 09/17/2021    Subjective:             Reason for Visit:  No chief complaint on file.      Julie Bright is a 30 y.o. female.     Cancer Staging  No matching staging information was found for the patient.    History of Present Illness    This is a patient who had been initially referred for evaluation of iron deficiency anemia. ?Review of her records indicates that she had mild microcytic anemia in June 2018 with hemoglobin of 11.4. ?Subsequently in February 2020 her hemoglobin was noted to be 11.8 with MCV of 75. ?However more recently in May 2021 her hemoglobin was 13.3 with MCV of 88. ?Serum iron was 92 with TIBC of 350, iron saturation 26% and ferritin of 76.8. ?In August 2021 hemoglobin/hematocrit were 12.9/39, MCV 88, serum iron 83, TIBC 355, iron saturation 23% and ferritin of 40.2. ?B12 was mildly decreased at 343 and folate was low normal at 4.7. ?It appears that patient had been scheduled to receive Venofer in September, October and November 2020 as well as February 2021. ?The notes indicate that she could not complete these infusions due to IV infiltration. ?She apparently cannot tolerate oral iron as well.  She has been started on B12 injections weekly.  Patient had labs drawn on 08/13/2021 which showed WBC of 6100 hemoglobin/hematocrit of 12.4/37.1, platelets of 276,000, serum iron of 62, TIBC 445, iron saturation 14% and ferritin of 10.4 she has chronic fatigue.      Past medical history: Asthma, migraines, GERD, psychogenic/nonepileptic seizures, anxiety/depression, hypothyroidism, neurocardiogenic syncope, bipolar disorder, fibromyalgia    Past surgical history: Roux-en-Y, duodenal switch, right wedge resection x2 with pleural biopsy.  Cholecystectomy, carpal tunnel release, knee arthroscopy, meniscectomy, septoplasty.    Family history: Mother has had diabetes and heart disease.  Maternal aunt had thyroid cancer and maternal grandfather had lung cancer.    Social history: Former smoker, infrequent alcohol. ?Currently on disability.          Review of Systems   Constitutional: Positive for activity change and fatigue.   HENT: Negative.    Eyes: Negative.    Respiratory: Negative.    Cardiovascular: Negative.    Gastrointestinal: Negative.    Genitourinary: Negative.    Musculoskeletal: Positive for back pain.   Skin: Negative.    Neurological: Positive for dizziness, syncope and light-headedness.   Psychiatric/Behavioral: Negative.          Objective:         ? albuterol sulfate (PROAIR HFA) 90 mcg/actuation HFA aerosol inhaler Inhale 2 Puffs by mouth into the lungs four times daily as needed for Wheezing or Shortness of Breath. Shake well before use.   ? calcium carbonate (TUMS PO) Take  by mouth.   ? cariprazine (VRAYLAR) 1.5 mg cap Take 1 capsule by mouth twice daily.   ? cyanocobalamin (VITAMIN B-12, RUBRAMIN) 1,000 mcg/mL injection Inject 1 mL into the muscle every 30 days.   ? dextroamphetamine-amphetamine (ADDERALL) 10 mg tablet Take 10 mg by mouth three times daily   ? divalproex (DEPAKOTE EC) 250 mg DR tablet Take 250 mg by mouth twice daily. Take with food.   ? duloxetine DR (CYMBALTA) 30 mg capsule Take one capsule by mouth  twice daily.   ? esomeprazole DR (NEXIUM) 40 mg capsule Take 20 mg by mouth as Needed. Take on an empty stomach at least 1 hour before or 2 hours after food.   ? fluticasone propion-salmeteroL (ADVAIR HFA) 45-21 mcg/actuation inhaler Inhale 2 puffs by mouth into the lungs twice daily.   ? HYDROmorphone (DILAUDID) 2 mg tablet Take 2 mg by mouth every 4 hours as needed for Pain   ? hydrOXYzine pamoate (VISTARIL) 50 mg capsule Take 100 mg by mouth three times daily as needed for Itching.   ? levocetirizine 5 mg tab Take 1 tablet by mouth twice daily.   ? levothyroxine (SYNTHROID) 100 mcg tablet Take 100 mcg by mouth daily.   ? midodrine (PROAMATINE) 10 mg tablet Take one tablet by mouth three times daily.   ? Miscellaneous Medical Supply misc Compression stockings:  Waist-high 30-40 mm Hg compression ([employer] / [employer]).  Wear when upright   ? pregabalin (LYRICA) 300 mg capsule Take one capsule by mouth twice daily for 30 days.   ? rizatriptan (MAXALT) 10 mg tablet Take one tablet by mouth at onset of headache. May repeat after 2 hours. Max of 30 mg in 24 hours.   ? tiotropium bromide (SPIRIVA RESPIMAT) 2.5 mcg/actuation inhaler Inhale 2 puffs by mouth into the lungs daily.   ? [START ON 12/08/2021] tirzepatide (MOUNJARO) 10 mg/0.5 mL injector PEN Inject 0.5 mL under the skin every 7 days.   ? [START ON 01/05/2022] tirzepatide (MOUNJARO) 12.5 mg/0.5 mL injector PEN Inject 0.5 mL under the skin every 7 days.   ? [START ON 02/05/2022] tirzepatide (MOUNJARO) 15 mg/0.5 mL injector PEN Inject 0.5 mL under the skin every 7 days.   ? tirzepatide (MOUNJARO) 2.5 mg/0.5 mL injector PEN Inject 0.5 mL under the skin every 7 days.   ? [START ON 10/07/2021] tirzepatide (MOUNJARO) 5 mg/0.5 mL injector EN Inject 0.5 mL under the skin every 7 days.   ? [START ON 11/07/2021] tirzepatide (MOUNJARO) 7.5 mg/0.5 mL injector PEN Inject 0.5 mL under the skin every 7 days.   ? tiZANidine (ZANAFLEX) 4 mg tablet Take 4 mg by mouth every 6 hours as needed.   ? vitamin E (AQUASOL E) 50 unit/mL drop oral solution Take  by mouth daily.     There were no vitals filed for this visit.  There is no height or weight on file to calculate BMI.                  Pain Addressed:  N/A    Patient Evaluated for a Clinical Trial: Patient not eligible for a treatment trial (including not needing treatment, needs palliative care, in remission).     [place] Cooperative Oncology Group performance status is 2, Ambulatory and capable of all selfcare but unable to carry out any work activities. Up and about more than 50% of waking hours.     Physical Exam  Constitutional:       Appearance: She is well-developed. She is obese.   HENT: Head: Normocephalic and atraumatic.      Mouth/Throat:      Pharynx: No oropharyngeal exudate.   Eyes:      General: No scleral icterus.     Pupils: Pupils are equal, round, and reactive to light.   Neck:      Vascular: No JVD.   Cardiovascular:      Rate and Rhythm: Normal rate and regular rhythm.      Heart sounds: Normal heart sounds.  No murmur heard.  Pulmonary:      Effort: Pulmonary effort is normal. No respiratory distress.      Breath sounds: Normal breath sounds. No rales.   Chest:      Chest wall: No tenderness.   Abdominal:      General: Bowel sounds are normal. There is no distension.      Palpations: Abdomen is soft. There is no mass.      Tenderness: There is no abdominal tenderness. There is no guarding.   Musculoskeletal:         General: No tenderness. Normal range of motion.      Cervical back: Normal range of motion.   Lymphadenopathy:      Cervical: No cervical adenopathy.   Skin:     Coloration: Skin is not pale.      Findings: Rash (Excoriations lower extremities) present.      Nails: There is no clubbing.   Neurological:      Mental Status: She is alert and oriented to person, place, and time.               Assessment and Plan:  History of iron deficiency: Most likely related to bariatric surgery.  Patient currently is not anemic but does have iron deficiency.  In view of her chronic fatigue we will set her up to receive Venofer 200 mg IV weekly x5 at [facility] infusion center.  She has had problems with IV access historically but I am reluctant to schedule her for Port-A-Cath in view of potential complications.  For the present time I would favor a midline.  If she requires frequent iron infusions, would then set her up to have a Port-A-Cath placement.  Patient will see me back in 6 months with CBC and iron studies.

## 2021-09-17 ENCOUNTER — Encounter: Admit: 2021-09-17 | Discharge: 2021-09-17 | Payer: Medicare Other | Primary: Family

## 2021-09-17 DIAGNOSIS — E039 Hypothyroidism, unspecified: Secondary | ICD-10-CM

## 2021-09-17 DIAGNOSIS — E611 Iron deficiency: Secondary | ICD-10-CM

## 2021-09-17 DIAGNOSIS — F32A Depression: Secondary | ICD-10-CM

## 2021-09-17 DIAGNOSIS — E669 Obesity, unspecified: Secondary | ICD-10-CM

## 2021-09-17 DIAGNOSIS — F419 Anxiety disorder, unspecified: Secondary | ICD-10-CM

## 2021-09-17 DIAGNOSIS — G47411 Narcolepsy with cataplexy: Secondary | ICD-10-CM

## 2021-09-17 DIAGNOSIS — K219 Gastro-esophageal reflux disease without esophagitis: Secondary | ICD-10-CM

## 2021-09-17 DIAGNOSIS — E079 Disorder of thyroid, unspecified: Secondary | ICD-10-CM

## 2021-09-17 DIAGNOSIS — E282 Polycystic ovarian syndrome: Secondary | ICD-10-CM

## 2021-09-17 DIAGNOSIS — G500 Trigeminal neuralgia: Secondary | ICD-10-CM

## 2021-09-17 DIAGNOSIS — F909 Attention-deficit hyperactivity disorder, unspecified type: Principal | ICD-10-CM

## 2021-09-17 DIAGNOSIS — F319 Bipolar disorder, unspecified: Secondary | ICD-10-CM

## 2021-09-17 DIAGNOSIS — J45909 Unspecified asthma, uncomplicated: Secondary | ICD-10-CM

## 2021-09-17 DIAGNOSIS — R079 Chest pain, unspecified: Secondary | ICD-10-CM

## 2021-09-17 NOTE — Telephone Encounter
Pts Mother notified that Dr. Sinclair Ship sent orders to [facility] for pt to have a PICC line placed and receive Venofer.

## 2021-09-17 NOTE — Telephone Encounter
Orders sent to [facility] for Iron Dextran, spoke to patients mother and explained that Dr. Sinclair Ship does not want to put in a midline due to the fact that it needs to be flushed everyday and he is concerned about infection. Dr. Sinclair Ship wrote orders for Iron Dextran instead of Venofer, so patient can just have one infusion instead of weekly X 5. Pts mother verbalized understanding. Spoke with Sue Lush at [facility] 3rd floor infusion to review plan of care.

## 2021-10-07 ENCOUNTER — Encounter: Admit: 2021-10-07 | Discharge: 2021-10-07 | Payer: Medicare Other | Primary: Family

## 2021-10-14 ENCOUNTER — Encounter: Admit: 2021-10-14 | Discharge: 2021-10-14 | Payer: Medicare Other | Primary: Family

## 2021-10-22 ENCOUNTER — Encounter: Admit: 2021-10-22 | Discharge: 2021-10-22 | Payer: Medicare Other | Primary: Family

## 2021-10-22 MED ORDER — PREGABALIN 300 MG PO CAP
300 mg | ORAL_CAPSULE | Freq: Two times a day (BID) | ORAL | 0 refills
Start: 2021-10-22 — End: ?

## 2021-10-22 NOTE — Telephone Encounter
Received pregabalin refill request for Julie Bright. LOV 08/19/2021, medication included in plan of care set forth by Dr. Roger Kill. Last refill requested 08/19/2021. Request pended to Dr. Roger Kill for approval/denial.

## 2021-11-12 ENCOUNTER — Encounter: Admit: 2021-11-12 | Discharge: 2021-11-12 | Payer: Medicare Other | Primary: Family

## 2021-11-12 DIAGNOSIS — R55 Syncope and collapse: Secondary | ICD-10-CM

## 2021-11-12 DIAGNOSIS — R0989 Other specified symptoms and signs involving the circulatory and respiratory systems: Secondary | ICD-10-CM

## 2021-11-12 DIAGNOSIS — G90A POTS (postural orthostatic tachycardia syndrome): Secondary | ICD-10-CM

## 2021-11-12 MED ORDER — MOUNJARO 12.5 MG/0.5 ML SC PNIJ
12.5 mg | SUBCUTANEOUS | 0 refills | Status: AC
Start: 2021-11-12 — End: ?

## 2021-12-09 ENCOUNTER — Ambulatory Visit: Admit: 2021-12-09 | Discharge: 2021-12-10 | Payer: Medicare Other | Primary: Family

## 2021-12-09 ENCOUNTER — Encounter: Admit: 2021-12-09 | Discharge: 2021-12-09 | Payer: Medicare Other | Primary: Family

## 2021-12-09 VITALS — BP 111/73 | HR 75 | Temp 97.10000°F | Ht 68.0 in | Wt 304.0 lb

## 2021-12-09 DIAGNOSIS — F32A Depression: Secondary | ICD-10-CM

## 2021-12-09 DIAGNOSIS — E282 Polycystic ovarian syndrome: Secondary | ICD-10-CM

## 2021-12-09 DIAGNOSIS — F319 Bipolar disorder, unspecified: Secondary | ICD-10-CM

## 2021-12-09 DIAGNOSIS — E038 Other specified hypothyroidism: Secondary | ICD-10-CM

## 2021-12-09 DIAGNOSIS — G500 Trigeminal neuralgia: Secondary | ICD-10-CM

## 2021-12-09 DIAGNOSIS — E079 Disorder of thyroid, unspecified: Secondary | ICD-10-CM

## 2021-12-09 DIAGNOSIS — E669 Obesity, unspecified: Secondary | ICD-10-CM

## 2021-12-09 DIAGNOSIS — F419 Anxiety disorder, unspecified: Secondary | ICD-10-CM

## 2021-12-09 DIAGNOSIS — R079 Chest pain, unspecified: Secondary | ICD-10-CM

## 2021-12-09 DIAGNOSIS — G47411 Narcolepsy with cataplexy: Secondary | ICD-10-CM

## 2021-12-09 DIAGNOSIS — K219 Gastro-esophageal reflux disease without esophagitis: Secondary | ICD-10-CM

## 2021-12-09 DIAGNOSIS — J45909 Unspecified asthma, uncomplicated: Secondary | ICD-10-CM

## 2021-12-09 DIAGNOSIS — F909 Attention-deficit hyperactivity disorder, unspecified type: Secondary | ICD-10-CM

## 2021-12-09 DIAGNOSIS — E039 Hypothyroidism, unspecified: Secondary | ICD-10-CM

## 2021-12-10 DIAGNOSIS — E559 Vitamin D deficiency, unspecified: Secondary | ICD-10-CM

## 2021-12-10 DIAGNOSIS — E063 Autoimmune thyroiditis: Secondary | ICD-10-CM

## 2021-12-10 DIAGNOSIS — E611 Iron deficiency: Secondary | ICD-10-CM

## 2021-12-10 DIAGNOSIS — Z9884 Bariatric surgery status: Principal | ICD-10-CM

## 2021-12-13 ENCOUNTER — Encounter: Admit: 2021-12-13 | Discharge: 2021-12-13 | Payer: Medicare Other | Primary: Family

## 2021-12-13 DIAGNOSIS — E039 Hypothyroidism, unspecified: Secondary | ICD-10-CM

## 2021-12-13 DIAGNOSIS — E282 Polycystic ovarian syndrome: Secondary | ICD-10-CM

## 2021-12-13 DIAGNOSIS — F419 Anxiety disorder, unspecified: Secondary | ICD-10-CM

## 2021-12-13 DIAGNOSIS — R079 Chest pain, unspecified: Secondary | ICD-10-CM

## 2021-12-13 DIAGNOSIS — E669 Obesity, unspecified: Secondary | ICD-10-CM

## 2021-12-13 DIAGNOSIS — F319 Bipolar disorder, unspecified: Secondary | ICD-10-CM

## 2021-12-13 DIAGNOSIS — E079 Disorder of thyroid, unspecified: Secondary | ICD-10-CM

## 2021-12-13 DIAGNOSIS — F32A Depression: Secondary | ICD-10-CM

## 2021-12-13 DIAGNOSIS — J45909 Unspecified asthma, uncomplicated: Secondary | ICD-10-CM

## 2021-12-13 DIAGNOSIS — K219 Gastro-esophageal reflux disease without esophagitis: Secondary | ICD-10-CM

## 2021-12-13 DIAGNOSIS — G500 Trigeminal neuralgia: Secondary | ICD-10-CM

## 2021-12-13 DIAGNOSIS — F909 Attention-deficit hyperactivity disorder, unspecified type: Secondary | ICD-10-CM

## 2021-12-13 DIAGNOSIS — G47411 Narcolepsy with cataplexy: Secondary | ICD-10-CM

## 2022-01-12 ENCOUNTER — Encounter: Admit: 2022-01-12 | Discharge: 2022-01-12 | Payer: Medicare Other | Primary: Family

## 2022-01-12 DIAGNOSIS — E038 Other specified hypothyroidism: Secondary | ICD-10-CM

## 2022-01-12 DIAGNOSIS — E611 Iron deficiency: Secondary | ICD-10-CM

## 2022-01-12 DIAGNOSIS — E559 Vitamin D deficiency, unspecified: Secondary | ICD-10-CM

## 2022-01-12 DIAGNOSIS — Z9884 Bariatric surgery status: Secondary | ICD-10-CM

## 2022-01-19 ENCOUNTER — Encounter: Admit: 2022-01-19 | Discharge: 2022-01-19 | Payer: Medicare Other | Primary: Family

## 2022-01-19 DIAGNOSIS — E559 Vitamin D deficiency, unspecified: Secondary | ICD-10-CM

## 2022-01-19 DIAGNOSIS — E039 Hypothyroidism, unspecified: Secondary | ICD-10-CM

## 2022-01-19 DIAGNOSIS — E213 Hyperparathyroidism, unspecified: Secondary | ICD-10-CM

## 2022-01-19 MED ORDER — ERGOCALCIFEROL (VITAMIN D2) 1,250 MCG (50,000 UNIT) PO CAP
1 | ORAL_CAPSULE | ORAL | 1 refills | 56.00000 days | Status: AC
Start: 2022-01-19 — End: ?

## 2022-01-19 MED ORDER — LEVOTHYROXINE 100 MCG PO TAB
100 ug | ORAL_TABLET | Freq: Every day | ORAL | 2 refills | 30.00000 days | Status: AC
Start: 2022-01-19 — End: ?

## 2022-02-16 ENCOUNTER — Encounter: Admit: 2022-02-16 | Discharge: 2022-02-16 | Payer: Medicare Other | Primary: Family

## 2022-02-16 ENCOUNTER — Ambulatory Visit: Admit: 2022-02-16 | Discharge: 2022-02-16 | Payer: Medicare Other | Primary: Family

## 2022-02-16 DIAGNOSIS — E282 Polycystic ovarian syndrome: Secondary | ICD-10-CM

## 2022-02-16 DIAGNOSIS — F445 Conversion disorder with seizures or convulsions: Secondary | ICD-10-CM

## 2022-02-16 DIAGNOSIS — F909 Attention-deficit hyperactivity disorder, unspecified type: Principal | ICD-10-CM

## 2022-02-16 DIAGNOSIS — F32A Depression: Secondary | ICD-10-CM

## 2022-02-16 DIAGNOSIS — K219 Gastro-esophageal reflux disease without esophagitis: Secondary | ICD-10-CM

## 2022-02-16 DIAGNOSIS — G500 Trigeminal neuralgia: Secondary | ICD-10-CM

## 2022-02-16 DIAGNOSIS — R079 Chest pain, unspecified: Secondary | ICD-10-CM

## 2022-02-16 DIAGNOSIS — G47411 Narcolepsy with cataplexy: Secondary | ICD-10-CM

## 2022-02-16 DIAGNOSIS — E039 Hypothyroidism, unspecified: Secondary | ICD-10-CM

## 2022-02-16 DIAGNOSIS — M797 Fibromyalgia: Secondary | ICD-10-CM

## 2022-02-16 DIAGNOSIS — E669 Obesity, unspecified: Secondary | ICD-10-CM

## 2022-02-16 DIAGNOSIS — F319 Bipolar disorder, unspecified: Secondary | ICD-10-CM

## 2022-02-16 DIAGNOSIS — E079 Disorder of thyroid, unspecified: Secondary | ICD-10-CM

## 2022-02-16 DIAGNOSIS — G43709 Chronic migraine without aura, not intractable, without status migrainosus: Secondary | ICD-10-CM

## 2022-02-16 DIAGNOSIS — F419 Anxiety disorder, unspecified: Secondary | ICD-10-CM

## 2022-02-16 DIAGNOSIS — J45909 Unspecified asthma, uncomplicated: Secondary | ICD-10-CM

## 2022-02-16 NOTE — Progress Notes
Date of Service: 02/16/22    Subjective:       Chief Complaint:   Chief Complaint   Patient presents with   ? Follow Up     Psychogenic nonepileptic events, migraine headaches, fibromyalgia         Julie Bright is a 31 year old woman who is evaluated in follow-up regarding the above.  Her mother, Julie Bright, accompanied her for her initial consultation in August 2020 and helped relate the history.  In 2016, the patient began to experience burning quality pain in the left maxillary region which can last several hours in duration.     She has had episodes of tremulousness and slurred speech.  She underwent extensive work-up under the care of Dr. Ferne Bright fields, epileptologist at [facility]  Prior to that, she had been seen by Dr. Gerlean Bright.  Initial EEG was interpreted as markedly abnormal with frequent theta frequency slowing admixed delta and theta frequency slowing, most often over the left hemisphere laterally.  At times this appeared to be generalized with a bifrontal dominance.  Occasional focal sharp waves were noted bitemporally.  She was placed on levetiracetam, but developed dyspnea, so transition to lacosamide.  Her episodes continue to occur.  Lyrica which had been prescribed for fibromyalgia has been of some benefit.  She underwent CCTV-EEG monitoring in early 2019 with similar findings, but no clear seizures identified throughout the study.  It was felt that her episodes were likely nonepileptic.     She has had 1-2 migraines per month.  Triptans have been helpful in the past.     In the past, she has tried carbamazepine, gabapentin.  She has also been on Depakote 250 mg twice daily along with Lyrica 100 mg 3 times daily.  The latter 2 medications have helped her fibromyalgia and migraines.  More recently she has been on Lyrica 300 mg twice daily along with duloxetine 30 mg twice daily.  This is also worked quite nicely.     When I first met her in August 2020, records from [facility] were reviewed.  MRI brain from July 2018 demonstrated 1.5 x 1.4 x 0.6 cm homogeneously enhancing mass along the anterior glenoid process of the left sphenoid bone.  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  At the time of her initial consultation in August 2020 we discussed her psychogenic nonepileptic events in detail.  Since these mainly seem to be stress-induced, I encouraged her to work with a therapist using the Julie Bright therapist/patient workbooks to see if this was helpful.  We also talked about the possibly going to another comprehensive epilepsy center, but she and her mother had decided against this.  When I saw her in February 2022, she was undergoing psychotherapy with Dr. Genelle Bright and reported fewer psychogenic nonepileptic events.  As long as she is undergoing psychotherapy, these episodes are markedly diminished.  When I last saw her in November 2022, she reported that she had also had some issues with stealing which is one of the reasons that she was back working with Dr. Genelle Bright.  Since I last saw her in November 2022, she has really been doing well.  Her mother states that she is in a much better place.  She has not had any psychogenic seizures, episodes of stealing, or depression or anxiety.  Her weight has also diminished.      Medical History:   Diagnosis Date   ? Acid reflux    ? ADHD 01/03/2019   ?  ADHD (attention deficit hyperactivity disorder)    ? Anxiety    ? Anxiety 01/03/2019   ? Asthma    ? Bipolar 1 disorder (HCC)    ? Chest pain 01/03/2019   ? Depression    ? Depression 01/03/2019   ? Hypothyroidism    ? Narcolepsy and cataplexy    ? Obesity 01/03/2019   ? PCOS (polycystic ovarian syndrome)    ? Thyroid disorder    ? Trigeminal neuralgia of left side of face             Social Determinants of Health     Tobacco Use: Medium Risk   ? Smoking Tobacco Use: Former   ? Smokeless Tobacco Use: Never   ? Passive Exposure: Not on file   Alcohol Use: Not on file   Financial Resource Strain: Not on file   Food Insecurity: Not on file   Transportation Needs: Not on file   Stress: Not on file   Social Connections: Not on file   Health Literacy: Not on file   Depression: Not at risk   ? PHQ-2 Score: 0   Housing Stability: Not on file         Family History   Problem Relation Age of Onset   ? Paroxysmal SVT Mother    ? Diabetes Mother    ? Hyperlipidemia Mother    ? Thyroid Disease Maternal Grandmother    ? Hyperlipidemia Maternal Grandmother    ? Hyperlipidemia Maternal Grandfather    ? Hypertension Maternal Grandfather    ? Thyroid Disease Paternal Great-Grandmother    ? Cancer-Thyroid Maternal Aunt        Review of Systems  Constitutional: Positive for diaphoresis. Negative for chills, fever and unexpected weight change.   HENT: Negative for hearing loss, mouth sores and sore throat.    Eyes: Negative for visual disturbance.   Respiratory: Negative for choking and shortness of breath.    Cardiovascular: Negative for chest pain and palpitations.   Gastrointestinal: Negative for diarrhea, nausea and vomiting.   Endocrine: Positive for cold intolerance.   Genitourinary: Negative for dysuria, frequency and urgency.   Musculoskeletal: Negative for arthralgias and myalgias.   Skin: Negative for color change and rash.   Allergic/Immunologic: Positive for environmental allergies and food allergies.   Psychiatric/Behavioral: Negative for dysphoric mood and suicidal ideas. The patient is not nervous/anxious.           Objective:         Allergies   Allergen Reactions   ? Bacitracin NAUSEA AND VOMITING, ITCHING and RASH   ? Bactrim [Sulfamethoxazole-Trimethoprim] NAUSEA ONLY and RASH   ? Ofloxacin EDEMA     Bad infection in eyes   ? Sulfa (Sulfonamide Antibiotics) RASH     hives  hives     ? Trimethoprim RASH   ? Bacitracin UNKNOWN   ? Hydrocodone-Aspirin HEADACHE and UNKNOWN   ? Lactase STOMACH UPSET   ? Lortab [Hydrocodone-Acetaminophen] UNKNOWN   ? Meloxicam UNKNOWN   ? Metaxalone NAUSEA ONLY   ? Neurontin [Gabapentin] UNKNOWN   ? Oxycodone UNKNOWN   ? Tramadol UNKNOWN       ? albuterol sulfate (PROAIR HFA) 90 mcg/actuation HFA aerosol inhaler Inhale two puffs by mouth into the lungs four times daily as needed for Wheezing or Shortness of Breath. Shake well before use.   ? calcium citrate (CALCITRATE) 950 mg tablet Take two tablets by mouth daily. 400mg  daily of elemental calcium   ? cariprazine (  VRAYLAR) 1.5 mg cap Take one capsule by mouth twice daily.   ? cyanocobalamin (VITAMIN B-12, RUBRAMIN) 1,000 mcg/mL injection Inject 1 mL into the muscle every 30 days.   ? duloxetine DR (CYMBALTA) 30 mg capsule Take one capsule by mouth twice daily.   ? ERGOcalciferoL (vitamin D2) (DRISDOL) 1,250 mcg (50,000 unit) capsule Take one capsule by mouth every 7 days.   ? fluticasone propion-salmeteroL (ADVAIR HFA) 45-21 mcg/actuation inhaler Inhale two puffs by mouth into the lungs twice daily.   ? hydrOXYzine HCL (ATARAX) 25 mg tablet TAKE 4 TABLETS BY MOUTH 3 TIMES A DAY   ? hydrOXYzine pamoate (VISTARIL) 50 mg capsule Take one capsule by mouth three times daily as needed for Itching.   ? levocetirizine 5 mg tab Take one tablet by mouth twice daily.   ? levothyroxine (SYNTHROID) 100 mcg tablet Take one tablet by mouth daily.   ? midodrine (PROAMATINE) 10 mg tablet Take one tablet by mouth three times daily.   ? pregabalin (LYRICA) 300 mg capsule Take one capsule by mouth twice daily.   ? rizatriptan (MAXALT) 10 mg tablet Take one tablet by mouth at onset of headache. May repeat after 2 hours. Max of 30 mg in 24 hours.   ? tiotropium bromide (SPIRIVA RESPIMAT) 2.5 mcg/actuation inhaler Inhale two puffs by mouth into the lungs daily.   ? tirzepatide (MOUNJARO) 10 mg/0.5 mL injector PEN Inject 0.5 mL under the skin every 7 days.   ? tirzepatide (MOUNJARO) 12.5 mg/0.5 mL injector PEN Inject 0.5 mL under the skin every 7 days.   ? tirzepatide (MOUNJARO) 15 mg/0.5 mL injector PEN Inject 0.5 mL under the skin every 7 days. ? tirzepatide (MOUNJARO) 7.5 mg/0.5 mL injector PEN Inject 0.5 mL under the skin every 7 days.     There were no vitals filed for this visit.  There is no height or weight on file to calculate BMI.     Physical Exam  Constitutional:       Appearance: Normal appearance.   HENT:      Head: Normocephalic and atraumatic.      Mouth/Throat:      Mouth: Mucous membranes are moist.   Eyes:      Extraocular Movements: Extraocular movements intact.      Pupils: Pupils are equal, round, and reactive to light.   Cardiovascular:      Rate and Rhythm: Normal rate and regular rhythm.   Pulmonary:      Effort: Pulmonary effort is normal. No respiratory distress.   Musculoskeletal:         General: No deformity. Normal range of motion.      Cervical back: Normal range of motion and neck supple.   Skin:     General: Skin is warm and dry.   Neurological:      Mental Status: She is alert.      Comments: Alert and oriented x4.  Recent and remote memory are intact.  Speech is normal.  Attention is excellent.  Cranial nerve examination reveals no facial asymmetry or decrease in facial sensation.  Eye findings as discussed above.  Tongue and palate movements are normal.  Neck and shoulder strength is good.  Motor examination is without drift.  Strength is excellent at 5/5 throughout both upper as well as lower extremities proximally distally.  Reflexes are symmetric at 1/4 throughout with flexor plantar responses bilaterally.  Sensory examination reveals intact light touch and pinprick sensation throughout.  Coordination is excellent without  evidence for dysmetria or ataxia in either of the upper lower extremities.  The patient was able to stand and ambulate without difficulty.   Psychiatric:         Mood and Affect: Mood normal.         Behavior: Behavior normal.     Office Procedure:           Assessment and Plan:    1. Psychogenic nonepileptic seizure    2. Chronic migraine w/o aura w/o status migrainosus, not intractable    3. Fibromyalgia        Psychogenic nonepileptic events which have improved dramatically with targeted cognitive behavioral therapy.    Chronic migraine headaches, overall markedly diminished.  Continue conservative therapy.    Fibromyalgia, remains problematic about but improved; continue pregabalin and duloxetine.    I will see her again for follow-up in 6 months from now.

## 2022-02-26 ENCOUNTER — Encounter: Admit: 2022-02-26 | Discharge: 2022-02-26 | Payer: Medicare Other | Primary: Family

## 2022-02-26 MED ORDER — PREGABALIN 300 MG PO CAP
ORAL_CAPSULE | 0 refills
Start: 2022-02-26 — End: ?

## 2022-03-02 ENCOUNTER — Encounter: Admit: 2022-03-02 | Discharge: 2022-03-02 | Payer: Medicare Other | Primary: Family

## 2022-03-17 ENCOUNTER — Encounter: Admit: 2022-03-17 | Discharge: 2022-03-17 | Payer: Medicare Other | Primary: Family

## 2022-03-17 DIAGNOSIS — E611 Iron deficiency: Secondary | ICD-10-CM

## 2022-03-17 DIAGNOSIS — Z862 Personal history of diseases of the blood and blood-forming organs and certain disorders involving the immune mechanism: Secondary | ICD-10-CM

## 2022-03-19 ENCOUNTER — Encounter: Admit: 2022-03-19 | Discharge: 2022-03-19 | Payer: Medicare Other | Primary: Family

## 2022-03-19 DIAGNOSIS — F419 Anxiety disorder, unspecified: Secondary | ICD-10-CM

## 2022-03-19 DIAGNOSIS — E079 Disorder of thyroid, unspecified: Secondary | ICD-10-CM

## 2022-03-19 DIAGNOSIS — F32A Depression: Secondary | ICD-10-CM

## 2022-03-19 DIAGNOSIS — J45909 Unspecified asthma, uncomplicated: Secondary | ICD-10-CM

## 2022-03-19 DIAGNOSIS — E039 Hypothyroidism, unspecified: Secondary | ICD-10-CM

## 2022-03-19 DIAGNOSIS — K219 Gastro-esophageal reflux disease without esophagitis: Secondary | ICD-10-CM

## 2022-03-19 DIAGNOSIS — R079 Chest pain, unspecified: Secondary | ICD-10-CM

## 2022-03-19 DIAGNOSIS — G47411 Narcolepsy with cataplexy: Secondary | ICD-10-CM

## 2022-03-19 DIAGNOSIS — F909 Attention-deficit hyperactivity disorder, unspecified type: Secondary | ICD-10-CM

## 2022-03-19 DIAGNOSIS — E611 Iron deficiency: Secondary | ICD-10-CM

## 2022-03-19 DIAGNOSIS — E282 Polycystic ovarian syndrome: Secondary | ICD-10-CM

## 2022-03-19 DIAGNOSIS — F319 Bipolar disorder, unspecified: Secondary | ICD-10-CM

## 2022-03-19 DIAGNOSIS — Z862 Personal history of diseases of the blood and blood-forming organs and certain disorders involving the immune mechanism: Secondary | ICD-10-CM

## 2022-03-19 DIAGNOSIS — E669 Obesity, unspecified: Secondary | ICD-10-CM

## 2022-03-19 DIAGNOSIS — G500 Trigeminal neuralgia: Secondary | ICD-10-CM

## 2022-03-19 LAB — CBC AND DIFF
ABSOLUTE BASO COUNT: 0 K/UL (ref 0–0.20)
ABSOLUTE EOS COUNT: 0.1 K/UL (ref 0–0.45)
ABSOLUTE LYMPH COUNT: 1.5 K/UL (ref 1.0–4.8)
ABSOLUTE MONO COUNT: 0.4 K/UL (ref 0–0.80)
ABSOLUTE NEUTROPHIL: 3.3 K/UL (ref 1.8–7.0)
BASOPHILS %: 1 % (ref 0–2)
EOSINOPHILS %: 2 % (ref 0–5)
HEMATOCRIT: 41 % (ref 36–45)
HEMOGLOBIN: 13 g/dL (ref 12.0–15.0)
LYMPHOCYTES %: 29 % (ref 24–44)
MCH: 27 pg (ref 26–34)
MCHC: 32 g/dL (ref 32.0–36.0)
MCV: 85 FL (ref 80–100)
MONOCYTES %: 8 % (ref 4–12)
MPV: 10 FL (ref 7–11)
NEUTROPHILS %: 60 % (ref 41–77)
RBC COUNT: 4.9 M/UL — ABNORMAL HIGH (ref 4.0–5.0)
RDW: 15 % — ABNORMAL HIGH (ref 11–15)
WBC COUNT: 5.4 K/UL — ABNORMAL HIGH (ref 4.5–11.0)

## 2022-03-19 LAB — IRON + BINDING CAPACITY + %SAT+ FERRITIN
IRON BINDING: 277 ug/dL (ref 270–380)
IRON: 129 ug/dL (ref 50–160)

## 2022-03-19 NOTE — Progress Notes
Name: Julie Bright          MRN: [id number]      DOB: 1991/01/29      AGE: 31 y.o.   DATE OF SERVICE: 03/19/2022    Subjective:             Reason for Visit:  No chief complaint on file.      Julie Bright is a 31 y.o. female.      Cancer Staging   No matching staging information was found for the patient.    History of Present Illness    This is a patient who had been initially referred for evaluation of iron deficiency anemia. ?Review of her records indicates that she had mild microcytic anemia in June 2018 with hemoglobin of 11.4. ?Subsequently in February 2020 her hemoglobin was noted to be 11.8 with MCV of 75. ?However more recently in May 2021 her hemoglobin was 13.3 with MCV of 88. ?Serum iron was 92 with TIBC of 350, iron saturation 26% and ferritin of 76.8. ?In August 2021 hemoglobin/hematocrit were 12.9/39, MCV 88, serum iron 83, TIBC 355, iron saturation 23% and ferritin of 40.2. ?B12 was mildly decreased at 343 and folate was low normal at 4.7. ?It appears that patient had been scheduled to receive Venofer in September, October and November 2020 as well as February 2021. ?The notes indicate that she could not complete these infusions due to IV infiltration. ?She apparently cannot tolerate oral iron as well.  She has been started on B12 injections weekly.  Patient had labs drawn on 08/13/2021 which showed WBC of 6100 hemoglobin/hematocrit of 12.4/37.1, platelets of 276,000, serum iron of 62, TIBC 445, iron saturation 14% and ferritin of 10.4 she has chronic fatigue.  Patient received INFeD 1 g on 09/25/2021 which she tolerated fairly well.    Past medical history: Asthma, migraines, GERD, psychogenic/nonepileptic seizures, anxiety/depression, hypothyroidism, neurocardiogenic syncope, bipolar disorder, fibromyalgia    Past surgical history: Roux-en-Y, duodenal switch, right wedge resection x2 with pleural biopsy.  Cholecystectomy, carpal tunnel release, knee arthroscopy, meniscectomy, septoplasty.    Family history: Mother has had diabetes and heart disease.  Maternal aunt had thyroid cancer and maternal grandfather had lung cancer.    Social history: Former smoker, infrequent alcohol. ?Currently on disability.          Review of Systems   Constitutional: Positive for activity change and fatigue.   HENT: Negative.    Eyes: Negative.    Respiratory: Negative.    Cardiovascular: Negative.    Gastrointestinal: Negative.    Genitourinary: Negative.    Musculoskeletal: Positive for back pain.   Skin: Negative.    Neurological: Positive for dizziness, syncope and light-headedness.   Psychiatric/Behavioral: Negative.          Objective:         ? albuterol sulfate (PROAIR HFA) 90 mcg/actuation HFA aerosol inhaler Inhale two puffs by mouth into the lungs four times daily as needed for Wheezing or Shortness of Breath. Shake well before use.   ? calcium citrate (CALCITRATE) 950 mg tablet Take two tablets by mouth daily. 400mg  daily of elemental calcium   ? cariprazine (VRAYLAR) 1.5 mg cap Take one capsule by mouth twice daily.   ? cyanocobalamin (VITAMIN B-12, RUBRAMIN) 1,000 mcg/mL injection Inject 1 mL into the muscle every 30 days.   ? duloxetine DR (CYMBALTA) 30 mg capsule Take one capsule by mouth twice daily.   ? ERGOcalciferoL (vitamin D2) (DRISDOL) 1,250 mcg (50,000 unit) capsule  Take one capsule by mouth every 7 days.   ? fluticasone propion-salmeteroL (ADVAIR HFA) 45-21 mcg/actuation inhaler Inhale two puffs by mouth into the lungs twice daily.   ? hydrOXYzine HCL (ATARAX) 25 mg tablet TAKE 4 TABLETS BY MOUTH 3 TIMES A DAY   ? hydrOXYzine pamoate (VISTARIL) 50 mg capsule Take one capsule by mouth three times daily as needed for Itching.   ? levocetirizine 5 mg tab Take one tablet by mouth twice daily.   ? levothyroxine (SYNTHROID) 100 mcg tablet Take one tablet by mouth daily.   ? midodrine (PROAMATINE) 10 mg tablet Take one tablet by mouth three times daily.   ? pregabalin (LYRICA) 300 mg capsule TAKE 1 CAPSULE BY MOUTH TWICE A DAY   ? rizatriptan (MAXALT) 10 mg tablet Take one tablet by mouth at onset of headache. May repeat after 2 hours. Max of 30 mg in 24 hours.   ? tiotropium bromide (SPIRIVA RESPIMAT) 2.5 mcg/actuation inhaler Inhale two puffs by mouth into the lungs daily.   ? tirzepatide (MOUNJARO) 15 mg/0.5 mL injector PEN Inject 0.5 mL under the skin every 7 days.     There were no vitals filed for this visit.  There is no height or weight on file to calculate BMI.                  Pain Addressed:  N/A    Patient Evaluated for a Clinical Trial: Patient not eligible for a treatment trial (including not needing treatment, needs palliative care, in remission).     [place] Cooperative Oncology Group performance status is 2, Ambulatory and capable of all selfcare but unable to carry out any work activities. Up and about more than 50% of waking hours.     Physical Exam  Constitutional:       Appearance: She is well-developed. She is obese.   HENT:      Head: Normocephalic and atraumatic.      Mouth/Throat:      Pharynx: No oropharyngeal exudate.   Eyes:      General: No scleral icterus.     Pupils: Pupils are equal, round, and reactive to light.   Neck:      Vascular: No JVD.   Cardiovascular:      Rate and Rhythm: Normal rate and regular rhythm.      Heart sounds: Normal heart sounds. No murmur heard.  Pulmonary:      Effort: Pulmonary effort is normal. No respiratory distress.      Breath sounds: Normal breath sounds. No rales.   Chest:      Chest wall: No tenderness.   Abdominal:      General: Bowel sounds are normal. There is no distension.      Palpations: Abdomen is soft. There is no mass.      Tenderness: There is no abdominal tenderness. There is no guarding.   Musculoskeletal:         General: No tenderness. Normal range of motion.      Cervical back: Normal range of motion.   Lymphadenopathy:      Cervical: No cervical adenopathy.   Skin:     Coloration: Skin is not pale.      Findings: Rash (Excoriations lower extremities) present.      Nails: There is no clubbing.   Neurological:      Mental Status: She is alert and oriented to person, place, and time.  Latest Reference Range & Units 03/19/22 08:00   Hemoglobin 12.0 - 15.0 GM/DL 16.1   Hematocrit 36 - 45 % 41.8   Platelet Count 150 - 400 K/UL 217   White Blood Cells 4.5 - 11.0 K/UL 5.4   Neutrophils 41 - 77 % 60   Absolute Neutrophil Count 1.8 - 7.0 K/UL 3.30   Lymphocytes 24 - 44 % 29   Absolute Lymph Count 1.0 - 4.8 K/UL 1.50   Monocytes 4 - 12 % 8   Absolute Monocyte Count 0 - 0.80 K/UL 0.40   Eosinophils 0 - 5 % 2   Absolute Eosinophil Count 0 - 0.45 K/UL 0.10   Absolute Basophil Count 0 - 0.20 K/UL 0.00   Basophils 0 - 2 % 1   RBC 4.0 - 5.0 M/UL 4.91   MCV 80 - 100 FL 85.2   MCH 26 - 34 PG 27.7   MCHC 32.0 - 36.0 G/DL 09.6   MPV 7 - 11 FL 04.5   RDW 11 - 15 % 15.7 (H)   (H): Data is abnormally high     Latest Reference Range & Units 12/31/21 10:29   Serum Folate >5.4 ng/mL 8.7   Vitamin B12 200 - 1,100 pg/mL 1,318 !   Iron 40 - 190 mcg/dL 96   % Saturation 16 - 45 % 52 !   Iron Binding-TIBC 250 - 450 mcg/dL 409 !   Ferritin 16 - 154 ng/mL 234 !   !: Data is abnormal  Assessment and Plan:  History of iron deficiency: Most likely related to bariatric surgery.  Patient tolerated INFeD fairly well.  Her CBC is completely normal today.  In March iron panel showed no evidence for deficiency in fact, B12, iron saturation and ferritin were all elevated.  Patient will see me back as needed.

## 2022-03-24 ENCOUNTER — Encounter: Admit: 2022-03-24 | Discharge: 2022-03-24 | Payer: Medicare Other | Primary: Family

## 2022-06-20 ENCOUNTER — Encounter: Admit: 2022-06-20 | Discharge: 2022-06-20 | Payer: Medicare Other | Primary: Family

## 2022-06-28 ENCOUNTER — Encounter: Admit: 2022-06-28 | Discharge: 2022-06-28 | Payer: Medicare Other | Primary: Family

## 2022-06-30 ENCOUNTER — Encounter: Admit: 2022-06-30 | Discharge: 2022-06-30 | Payer: Medicare Other | Primary: Family

## 2022-06-30 ENCOUNTER — Ambulatory Visit: Admit: 2022-06-30 | Discharge: 2022-07-01 | Payer: Medicare Other | Primary: Family

## 2022-06-30 NOTE — Patient Instructions
I will request labs from [facility] and then send you a message    great job on weight loss!!  A1c  4.3% way to go!    Keep up the good work!    Nurse extension: [phone]  Clinic number: [phone] scheduling   Infusion center: [phone]  Radiology scheduling number: [phone]

## 2022-07-01 ENCOUNTER — Encounter: Admit: 2022-07-01 | Discharge: 2022-07-01 | Payer: Medicare Other | Primary: Family

## 2022-07-01 DIAGNOSIS — E119 Type 2 diabetes mellitus without complications: Secondary | ICD-10-CM

## 2022-07-02 ENCOUNTER — Encounter: Admit: 2022-07-02 | Discharge: 2022-07-02 | Payer: Medicare Other | Primary: Family

## 2022-07-02 DIAGNOSIS — F909 Attention-deficit hyperactivity disorder, unspecified type: Secondary | ICD-10-CM

## 2022-07-02 DIAGNOSIS — F319 Bipolar disorder, unspecified: Secondary | ICD-10-CM

## 2022-07-02 DIAGNOSIS — E669 Obesity, unspecified: Secondary | ICD-10-CM

## 2022-07-02 DIAGNOSIS — E611 Iron deficiency: Secondary | ICD-10-CM

## 2022-07-02 DIAGNOSIS — R079 Chest pain, unspecified: Secondary | ICD-10-CM

## 2022-07-02 DIAGNOSIS — E079 Disorder of thyroid, unspecified: Secondary | ICD-10-CM

## 2022-07-02 DIAGNOSIS — E282 Polycystic ovarian syndrome: Secondary | ICD-10-CM

## 2022-07-02 DIAGNOSIS — F419 Anxiety disorder, unspecified: Secondary | ICD-10-CM

## 2022-07-02 DIAGNOSIS — E039 Hypothyroidism, unspecified: Secondary | ICD-10-CM

## 2022-07-02 DIAGNOSIS — F32A Depression: Secondary | ICD-10-CM

## 2022-07-02 DIAGNOSIS — G500 Trigeminal neuralgia: Secondary | ICD-10-CM

## 2022-07-02 DIAGNOSIS — K219 Gastro-esophageal reflux disease without esophagitis: Secondary | ICD-10-CM

## 2022-07-02 DIAGNOSIS — J45909 Unspecified asthma, uncomplicated: Secondary | ICD-10-CM

## 2022-07-02 DIAGNOSIS — G47411 Narcolepsy with cataplexy: Secondary | ICD-10-CM

## 2022-07-02 LAB — CBC AND DIFF
ABSOLUTE BASO COUNT: 0 K/UL (ref 0–0.20)
ABSOLUTE EOS COUNT: 0.1 K/UL (ref 0–0.45)
ABSOLUTE LYMPH COUNT: 1.2 K/UL (ref 1.0–4.8)
ABSOLUTE MONO COUNT: 0.4 K/UL (ref 0–0.80)
ABSOLUTE NEUTROPHIL: 4 K/UL (ref 1.8–7.0)
BASOPHILS %: 0 % (ref 0–2)
EOSINOPHILS %: 1 % (ref 0–5)
HEMATOCRIT: 34 % — ABNORMAL LOW (ref 36–45)
HEMOGLOBIN: 11 g/dL — ABNORMAL LOW (ref 12.0–15.0)
LYMPHOCYTES %: 21 % — ABNORMAL LOW (ref 24–44)
MCH: 28 pg (ref 26–34)
MCHC: 32 g/dL (ref 32.0–36.0)
MCV: 87 FL (ref 80–100)
MONOCYTES %: 8 % (ref 4–12)
MPV: 9 FL (ref 7–11)
NEUTROPHILS %: 70 % (ref 41–77)
PLATELET COUNT: 242 K/UL (ref 150–400)
RBC COUNT: 3.9 M/UL — ABNORMAL LOW (ref 4.0–5.0)
RDW: 14 % (ref 11–15)
WBC COUNT: 5.8 K/UL — ABNORMAL LOW (ref 4.5–11.0)

## 2022-07-02 LAB — IRON + BINDING CAPACITY + %SAT+ FERRITIN
IRON BINDING: 308 ug/dL (ref 270–380)
IRON: 54 ug/dL (ref 50–160)

## 2022-07-02 NOTE — Progress Notes
Name: Julie Bright          MRN: [id number]      DOB: 1991/07/10      AGE: 31 y.o.   DATE OF SERVICE: 07/02/2022    Subjective:             Reason for Visit:  No chief complaint on file.      Julie Bright is a 31 y.o. female.      Cancer Staging   No matching staging information was found for the patient.    History of Present Illness    This is a patient who had been initially referred for evaluation of iron deficiency anemia. ?Review of her records indicates that she had mild microcytic anemia in June 2018 with hemoglobin of 11.4. ?Subsequently in February 2020 her hemoglobin was noted to be 11.8 with MCV of 75. ?However more recently in May 2021 her hemoglobin was 13.3 with MCV of 88. ?Serum iron was 92 with TIBC of 350, iron saturation 26% and ferritin of 76.8. ?In August 2021 hemoglobin/hematocrit were 12.9/39, MCV 88, serum iron 83, TIBC 355, iron saturation 23% and ferritin of 40.2. ?B12 was mildly decreased at 343 and folate was low normal at 4.7. ?It appears that patient had been scheduled to receive Venofer in September, October and November 2020 as well as February 2021. ?The notes indicate that she could not complete these infusions due to IV infiltration. ?She apparently cannot tolerate oral iron as well.  She has been started on B12 injections weekly.  Patient had labs drawn on 08/13/2021 which showed WBC of 6100 hemoglobin/hematocrit of 12.4/37.1, platelets of 276,000, serum iron of 62, TIBC 445, iron saturation 14% and ferritin of 10.4 she has chronic fatigue.  Patient received INFeD 1 g on 09/25/2021 which she tolerated fairly well.  Patient has noted increasing fatigue and is concerned about iron deficiency.    Past medical history: Asthma, migraines, GERD, psychogenic/nonepileptic seizures, anxiety/depression, hypothyroidism, neurocardiogenic syncope, bipolar disorder, fibromyalgia    Past surgical history: Roux-en-Y, duodenal switch, right wedge resection x2 with pleural biopsy. Cholecystectomy, carpal tunnel release, knee arthroscopy, meniscectomy, septoplasty.    Family history: Mother has had diabetes and heart disease.  Maternal aunt had thyroid cancer and maternal grandfather had lung cancer.    Social history: Former smoker, infrequent alcohol. ?Currently on disability.          Review of Systems   Constitutional: Positive for activity change and fatigue.   HENT: Negative.    Eyes: Negative.    Respiratory: Negative.    Cardiovascular: Negative.    Gastrointestinal: Negative.    Genitourinary: Negative.    Musculoskeletal: Positive for back pain.   Skin: Negative.    Neurological: Positive for dizziness, syncope and light-headedness.   Psychiatric/Behavioral: Negative.          Objective:         ? albuterol sulfate (PROAIR HFA) 90 mcg/actuation HFA aerosol inhaler Inhale two puffs by mouth into the lungs four times daily as needed for Wheezing or Shortness of Breath. Shake well before use.   ? calcium citrate (CALCITRATE) 950 mg tablet Take two tablets by mouth daily. 400mg  daily of elemental calcium   ? cariprazine (VRAYLAR) 1.5 mg cap Take one capsule by mouth twice daily.   ? cyanocobalamin (VITAMIN B-12, RUBRAMIN) 1,000 mcg/mL injection Inject 1 mL into the muscle every 30 days.   ? duloxetine DR (CYMBALTA) 30 mg capsule Take one capsule by mouth twice daily.   ?  ERGOcalciferoL (vitamin D2) (DRISDOL) 1,250 mcg (50,000 unit) capsule Take one capsule by mouth every 7 days.   ? fluticasone propion-salmeteroL (ADVAIR HFA) 45-21 mcg/actuation inhaler Inhale two puffs by mouth into the lungs twice daily.   ? hydrOXYzine pamoate (VISTARIL) 50 mg capsule Take one capsule by mouth three times daily as needed for Itching.   ? levocetirizine 5 mg tab Take one tablet by mouth twice daily.   ? levothyroxine (SYNTHROID) 100 mcg tablet Take one tablet by mouth daily.   ? midodrine (PROAMATINE) 10 mg tablet Take one tablet by mouth three times daily.   ? pregabalin (LYRICA) 300 mg capsule TAKE 1 CAPSULE BY MOUTH TWICE A DAY   ? rizatriptan (MAXALT) 10 mg tablet Take one tablet by mouth at onset of headache. May repeat after 2 hours. Max of 30 mg in 24 hours.   ? tiotropium bromide (SPIRIVA RESPIMAT) 2.5 mcg/actuation inhaler Inhale two puffs by mouth into the lungs daily.   ? tirzepatide (MOUNJARO) 15 mg/0.5 mL injector PEN Inject 0.5 mL under the skin every 7 days.     There were no vitals filed for this visit.  There is no height or weight on file to calculate BMI.                  Pain Addressed:  N/A    Patient Evaluated for a Clinical Trial: Patient not eligible for a treatment trial (including not needing treatment, needs palliative care, in remission).     [place] Cooperative Oncology Group performance status is 2, Ambulatory and capable of all selfcare but unable to carry out any work activities. Up and about more than 50% of waking hours.     Physical Exam  Constitutional:       Appearance: She is well-developed. She is obese.   HENT:      Head: Normocephalic and atraumatic.      Mouth/Throat:      Pharynx: No oropharyngeal exudate.   Eyes:      General: No scleral icterus.     Pupils: Pupils are equal, round, and reactive to light.   Neck:      Vascular: No JVD.   Cardiovascular:      Rate and Rhythm: Normal rate and regular rhythm.      Heart sounds: Normal heart sounds. No murmur heard.  Pulmonary:      Effort: Pulmonary effort is normal. No respiratory distress.      Breath sounds: Normal breath sounds. No rales.   Chest:      Chest wall: No tenderness.   Abdominal:      General: Bowel sounds are normal. There is no distension.      Palpations: Abdomen is soft. There is no mass.      Tenderness: There is no abdominal tenderness. There is no guarding.   Musculoskeletal:         General: No tenderness. Normal range of motion.      Cervical back: Normal range of motion.   Lymphadenopathy:      Cervical: No cervical adenopathy.   Skin:     Coloration: Skin is not pale.      Findings: Rash (Excoriations lower extremities) present.      Nails: There is no clubbing.   Neurological:      Mental Status: She is alert and oriented to person, place, and time.                 Latest Reference Range &  Units 07/02/22 14:41   Hemoglobin 12.0 - 15.0 GM/DL 21.3 (L)   Hematocrit 36 - 45 % 34.1 (L)   Platelet Count 150 - 400 K/UL 242   White Blood Cells 4.5 - 11.0 K/UL 5.8   Neutrophils 41 - 77 % 70   Absolute Neutrophil Count 1.8 - 7.0 K/UL 4.00   Lymphocytes 24 - 44 % 21 (L)   Absolute Lymph Count 1.0 - 4.8 K/UL 1.20   Monocytes 4 - 12 % 8   Absolute Monocyte Count 0 - 0.80 K/UL 0.40   Eosinophils 0 - 5 % 1   Absolute Eosinophil Count 0 - 0.45 K/UL 0.10   Absolute Basophil Count 0 - 0.20 K/UL 0.00   Basophils 0 - 2 % 0   RBC 4.0 - 5.0 M/UL 3.91 (L)   MCV 80 - 100 FL 87.4   MCH 26 - 34 PG 28.5   MCHC 32.0 - 36.0 G/DL 08.6   MPV 7 - 11 FL 9.0   RDW 11 - 15 % 14.6   (L): Data is abnormally low  Assessment and Plan:  History of iron deficiency: Most likely related to bariatric surgery.  Patient currently has mild anemia.  Her hemoglobin in June had been completely normal.  We will await iron studies.  Since she has symptomatic iron deficiency, will plan repeat IV iron if deficiency is confirmed.  She will be set up for 6 monthly lab checks and will see me back in 1 year.

## 2022-07-03 ENCOUNTER — Encounter: Admit: 2022-07-03 | Discharge: 2022-07-03 | Payer: Medicare Other | Primary: Family

## 2022-07-03 MED ORDER — PREGABALIN 300 MG PO CAP
ORAL_CAPSULE | 0 refills
Start: 2022-07-03 — End: ?

## 2022-07-06 ENCOUNTER — Encounter: Admit: 2022-07-06 | Discharge: 2022-07-06 | Payer: Medicare Other | Primary: Family

## 2022-07-06 DIAGNOSIS — E213 Hyperparathyroidism, unspecified: Secondary | ICD-10-CM

## 2022-07-06 DIAGNOSIS — E559 Vitamin D deficiency, unspecified: Secondary | ICD-10-CM

## 2022-07-06 DIAGNOSIS — E038 Other specified hypothyroidism: Secondary | ICD-10-CM

## 2022-07-07 ENCOUNTER — Encounter: Admit: 2022-07-07 | Discharge: 2022-07-07 | Payer: Medicare Other | Primary: Family

## 2022-07-08 ENCOUNTER — Encounter: Admit: 2022-07-08 | Discharge: 2022-07-08 | Payer: Medicare Other | Primary: Family

## 2022-07-08 DIAGNOSIS — E559 Vitamin D deficiency, unspecified: Secondary | ICD-10-CM

## 2022-07-08 MED ORDER — ERGOCALCIFEROL (VITAMIN D2) 1,250 MCG (50,000 UNIT) PO CAP
1 | ORAL_CAPSULE | ORAL | 1 refills | 56.00000 days | Status: AC
Start: 2022-07-08 — End: ?

## 2022-07-20 ENCOUNTER — Encounter: Admit: 2022-07-20 | Discharge: 2022-07-20 | Payer: Medicare Other | Primary: Family

## 2022-08-11 ENCOUNTER — Encounter: Admit: 2022-08-11 | Discharge: 2022-08-11 | Payer: Medicare Other | Primary: Family

## 2022-08-15 ENCOUNTER — Encounter: Admit: 2022-08-15 | Discharge: 2022-08-15 | Payer: Medicare Other | Primary: Family

## 2022-08-18 ENCOUNTER — Encounter: Admit: 2022-08-18 | Discharge: 2022-08-18 | Payer: Medicare Other | Primary: Family

## 2022-08-18 MED ORDER — PREGABALIN 300 MG PO CAP
300 mg | ORAL_CAPSULE | Freq: Two times a day (BID) | ORAL | 0 refills | Status: AC
Start: 2022-08-18 — End: ?

## 2022-08-18 NOTE — Telephone Encounter
Received Lyrica refill request for Julie Bright. LOV 02/16/2022, medication included in plan of care set forth by Dr. Roger Kill. Last refill requested 07/03/2022. Request pended to Dr. Roger Kill for approval/denial.

## 2022-09-07 ENCOUNTER — Encounter: Admit: 2022-09-07 | Discharge: 2022-09-07 | Payer: Medicare Other | Primary: Family

## 2022-09-07 ENCOUNTER — Ambulatory Visit: Admit: 2022-09-07 | Discharge: 2022-09-08 | Payer: Medicare Other | Primary: Family

## 2022-09-07 DIAGNOSIS — E079 Disorder of thyroid, unspecified: Secondary | ICD-10-CM

## 2022-09-07 DIAGNOSIS — R079 Chest pain, unspecified: Secondary | ICD-10-CM

## 2022-09-07 DIAGNOSIS — F32A Depression: Secondary | ICD-10-CM

## 2022-09-07 DIAGNOSIS — F419 Anxiety disorder, unspecified: Secondary | ICD-10-CM

## 2022-09-07 DIAGNOSIS — F909 Attention-deficit hyperactivity disorder, unspecified type: Secondary | ICD-10-CM

## 2022-09-07 DIAGNOSIS — J45909 Unspecified asthma, uncomplicated: Secondary | ICD-10-CM

## 2022-09-07 DIAGNOSIS — F319 Bipolar disorder, unspecified: Secondary | ICD-10-CM

## 2022-09-07 DIAGNOSIS — E669 Obesity, unspecified: Secondary | ICD-10-CM

## 2022-09-07 DIAGNOSIS — G43909 Migraine, unspecified, not intractable, without status migrainosus: Secondary | ICD-10-CM

## 2022-09-07 DIAGNOSIS — F79 Unspecified intellectual disabilities: Secondary | ICD-10-CM

## 2022-09-07 DIAGNOSIS — R519 Generalized headaches: Secondary | ICD-10-CM

## 2022-09-07 DIAGNOSIS — G47411 Narcolepsy with cataplexy: Secondary | ICD-10-CM

## 2022-09-07 DIAGNOSIS — R55 Syncope and collapse: Secondary | ICD-10-CM

## 2022-09-07 DIAGNOSIS — E039 Hypothyroidism, unspecified: Secondary | ICD-10-CM

## 2022-09-07 DIAGNOSIS — E282 Polycystic ovarian syndrome: Secondary | ICD-10-CM

## 2022-09-07 DIAGNOSIS — K219 Gastro-esophageal reflux disease without esophagitis: Secondary | ICD-10-CM

## 2022-09-07 DIAGNOSIS — G500 Trigeminal neuralgia: Secondary | ICD-10-CM

## 2022-09-07 NOTE — Progress Notes
Date of Service: 09/07/22    Subjective:       Chief Complaint:   Chief Complaint   Patient presents with   ? Follow Up     PNES, migraine headaches, fibromyalgia         Julie Bright is a 31 year old woman who is evaluated in follow-up regarding the above.  Her mother, Ajanai Vicks, accompanied her for her initial consultation in August 2020 and helped relate the history.  In 2016, the patient began to experience burning quality pain in the left maxillary region which can last several hours in duration.     She has had episodes of tremulousness and slurred speech.  She underwent extensive work-up under the care of Dr. Ina Kick, epileptologist at [facility].  Prior to that, she had been seen by Dr. Gerlean Ren.  Initial EEG was interpreted as markedly abnormal with frequent theta frequency slowing admixed delta and theta frequency slowing, most often over the left hemisphere laterally.  At times this appeared to be generalized with a bifrontal dominance.  Occasional focal sharp waves were noted bitemporally.  She was placed on levetiracetam, but developed dyspnea, so transitioned to lacosamide.  Her episodes continue to occur.  Lyrica which had been prescribed for fibromyalgia has been of some benefit.  She underwent CCTV-EEG monitoring in early 2019 with similar findings, but no clear seizures identified throughout the study.  It was felt that her episodes were likely nonepileptic.     She has had 1-2 migraines per month.  Triptans have been helpful in the past.     In the past, she has tried carbamazepine, gabapentin.  She has also been on Depakote 250 mg twice daily along with Lyrica 100 mg 3 times daily.  The latter 2 medications have helped her fibromyalgia and migraines.  Over the last few years she has remained on Lyrica 300 mg twice daily along with duloxetine 30 mg twice daily.  This has also worked quite nicely.     When I first met her in August 2020, records from [facility] were reviewed.  MRI brain from July 2018 demonstrated 1.5 x 1.4 x 0.6 cm homogeneously enhancing mass along the anterior glenoid process of the left sphenoid bone.  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  At the time of her initial consultation in August 2020 we discussed her psychogenic nonepileptic events in detail.  Since these mainly seem to be stress-induced, I encouraged her to work with a therapist using the [facility] therapist/patient workbooks to see if this was helpful.  We also talked about the possibly going to another comprehensive epilepsy center, but she and her mother had decided against this.  When I saw her in February 2022, she was undergoing psychotherapy with Dr. Genelle Gather and reported fewer psychogenic nonepileptic events.  As long as she is undergoing psychotherapy, these episodes are markedly diminished.  When I saw her in November 2022, she reported that she had also had some issues with stealing which is one of the reasons that she was back working with Dr. Genelle Gather.  Since I last saw her in May 2023, she had really been doing well.  Her mother stated that she was in a much better place.  She has not had any psychogenic seizures, episodes of stealing, or depression or anxiety.  Her weight has also diminished.  She continues to do well, now down to 250, from 400 pounds.  She and her mother agree that she is  doing well.         Medical History:   Diagnosis Date   ? Acid reflux    ? ADHD 01/03/2019   ? ADHD (attention deficit hyperactivity disorder)    ? Anxiety    ? Anxiety 01/03/2019   ? Asthma    ? Bipolar 1 disorder (HCC)    ? Chest pain 01/03/2019   ? Depression    ? Depression 01/03/2019   ? Generalized headaches    ? Hypothyroidism    ? Intellectual disability    ? Migraines    ? Narcolepsy and cataplexy    ? Obesity 01/03/2019   ? PCOS (polycystic ovarian syndrome)    ? Syncope and collapse    ? Thyroid disorder    ? Trigeminal neuralgia of left side of face Social Determinants of Health     Tobacco Use: Medium Risk (09/07/2022)    Patient History    ? Smoking Tobacco Use: Former    ? Smokeless Tobacco Use: Never    ? Passive Exposure: Not on file   Alcohol Use: Not on file (12/18/2020)   Financial Resource Strain: Not on file   Food Insecurity: Not on file   Transportation Needs: Not on file   Stress: Not on file   Social Connections: Not on file   Health Literacy: Not on file   Depression: Not at risk (09/07/2022)    PHQ-2    ? PHQ-2 Score: 0   Housing Stability: Not on file         Family History   Problem Relation Age of Onset   ? Paroxysmal SVT Mother    ? Diabetes Mother    ? Hyperlipidemia Mother    ? Tremor Mother    ? Thyroid Disease Maternal Grandmother    ? Hyperlipidemia Maternal Grandmother    ? Hyperlipidemia Maternal Grandfather    ? Hypertension Maternal Grandfather    ? Thyroid Disease Paternal Great-Grandmother    ? Cancer-Thyroid Maternal Aunt    ? Migraines Other         Great Aunt   ? Multiple sclerosis Other         Great Cousin       Review of Systems      Objective:         Allergies   Allergen Reactions   ? Bacitracin NAUSEA AND VOMITING, ITCHING and RASH   ? Bactrim [Sulfamethoxazole-Trimethoprim] NAUSEA ONLY and RASH   ? Ofloxacin EDEMA     Bad infection in eyes   ? Sulfa (Sulfonamide Antibiotics) RASH     hives  hives     ? Trimethoprim RASH   ? Bacitracin UNKNOWN   ? Hydrocodone-Aspirin HEADACHE and UNKNOWN   ? Ketotifen Fumarate UNKNOWN     Caused an eye infection.   Caused an eye infection.      ? Lactase STOMACH UPSET   ? Lortab [Hydrocodone-Acetaminophen] UNKNOWN   ? Meloxicam UNKNOWN   ? Metaxalone NAUSEA ONLY   ? Neurontin [Gabapentin] UNKNOWN   ? Oxycodone UNKNOWN   ? Tramadol UNKNOWN       ? albuterol sulfate (PROAIR HFA) 90 mcg/actuation HFA aerosol inhaler Inhale two puffs by mouth into the lungs four times daily as needed for Wheezing or Shortness of Breath. Shake well before use.   ? calcium citrate (CALCITRATE) 950 mg tablet Take two tablets by mouth daily. 400mg  daily of elemental calcium   ? cariprazine (VRAYLAR) 1.5 mg cap Take one capsule  by mouth twice daily.   ? CHOLEcalciferoL (vitamin D3) 1,000 units tablet Take two tablets by mouth daily.   ? cyanocobalamin (VITAMIN B-12, RUBRAMIN) 1,000 mcg/mL injection Inject 1 mL into the muscle every 30 days.   ? ERGOcalciferoL (vitamin D2) (DRISDOL) 1,250 mcg (50,000 unit) capsule Take one capsule by mouth every 7 days.   ? fluticasone propion-salmeteroL (ADVAIR HFA) 45-21 mcg/actuation inhaler Inhale two puffs by mouth into the lungs twice daily.   ? hydrOXYzine pamoate (VISTARIL) 50 mg capsule Take one capsule by mouth three times daily as needed for Itching.   ? levocetirizine 5 mg tab Take one tablet by mouth twice daily.   ? levothyroxine (SYNTHROID) 100 mcg tablet Take one tablet by mouth daily.   ? midodrine (PROAMATINE) 10 mg tablet Take one tablet by mouth three times daily.   ? pregabalin (LYRICA) 300 mg capsule Take one capsule by mouth twice daily.   ? rizatriptan (MAXALT) 10 mg tablet Take one tablet by mouth at onset of headache. May repeat after 2 hours. Max of 30 mg in 24 hours.   ? tiotropium bromide (SPIRIVA RESPIMAT) 2.5 mcg/actuation inhaler Inhale two puffs by mouth into the lungs daily.   ? tirzepatide (MOUNJARO) 10 mg/0.5 mL injector PEN Inject 0.5 mL under the skin every 7 days.     Vitals:    09/07/22 0808   BP: 109/74   Pulse: 80   PainSc: Zero   Weight: 112.5 kg (248 lb)   Height: 172.7 cm (5' 8)     Body mass index is 37.71 kg/m?Marland Kitchen     Physical Exam  Constitutional:       Appearance: Normal appearance.   HENT:      Head: Normocephalic and atraumatic.      Mouth/Throat:      Mouth: Mucous membranes are moist.   Eyes:      Extraocular Movements: Extraocular movements intact.      Pupils: Pupils are equal, round, and reactive to light.   Cardiovascular:      Rate and Rhythm: Normal rate and regular rhythm.   Pulmonary:      Effort: Pulmonary effort is normal. No respiratory distress.   Musculoskeletal:         General: No deformity. Normal range of motion.      Cervical back: Normal range of motion and neck supple.   Skin:     General: Skin is warm and dry.   Neurological:      Mental Status: She is alert.      Comments: Alert and oriented x4.  Recent and remote memory are intact.  Speech is normal.  Attention is excellent.  Cranial nerve examination reveals no facial asymmetry or decrease in facial sensation.  Eye findings as discussed above.  Tongue and palate movements are normal.  Neck and shoulder strength is good.  Motor examination is without drift.  Strength is excellent at 5/5 throughout both upper as well as lower extremities proximally distally.  Reflexes are symmetric at 1/4 throughout with flexor plantar responses bilaterally.  Sensory examination reveals intact light touch and pinprick sensation throughout.  Coordination is excellent without evidence for dysmetria or ataxia in either of the upper lower extremities.  The patient was able to stand and ambulate without difficulty.   Psychiatric:         Mood and Affect: Mood normal.         Behavior: Behavior normal.   Office Procedure:  Assessment and Plan:    1. Psychogenic nonepileptic seizure    2. Chronic migraine w/o aura w/o status migrainosus, not intractable    3. Fibromyalgia        PNES, markedly improved.  She has not had any episodes over the last year.    Chronic migraine headaches, improved, now down to only 1 or 2 severe headaches per month; continue triptans as needed.    Fibromyalgia, remains problematic, but improved; continue pregabalin and duloxetine.    I will see her again for follow-up in 1 year from now.    Total time today was 32 minutes in the following activities: preparing to see the patient, reviewing records, performing a medically appropriate examination, counseling and educating the patient, ordering tests, medications, and treatment, documenting critical information in the EHR, referring and communicating with other healthcare professionals (when not separately reported), and coordinating care.

## 2022-09-08 DIAGNOSIS — F445 Conversion disorder with seizures or convulsions: Secondary | ICD-10-CM

## 2022-09-08 DIAGNOSIS — M797 Fibromyalgia: Secondary | ICD-10-CM

## 2022-09-08 DIAGNOSIS — G43709 Chronic migraine without aura, not intractable, without status migrainosus: Secondary | ICD-10-CM

## 2022-09-20 ENCOUNTER — Encounter: Admit: 2022-09-20 | Discharge: 2022-09-20 | Payer: Medicare Other | Primary: Family

## 2022-09-22 ENCOUNTER — Ambulatory Visit: Admit: 2022-09-22 | Discharge: 2022-09-22 | Payer: Medicare Other | Primary: Family

## 2022-09-22 ENCOUNTER — Encounter: Admit: 2022-09-22 | Discharge: 2022-09-22 | Payer: Medicare Other | Primary: Family

## 2022-09-22 DIAGNOSIS — G500 Trigeminal neuralgia: Secondary | ICD-10-CM

## 2022-09-22 DIAGNOSIS — Z136 Encounter for screening for cardiovascular disorders: Secondary | ICD-10-CM

## 2022-09-22 DIAGNOSIS — F32A Depression: Secondary | ICD-10-CM

## 2022-09-22 DIAGNOSIS — R079 Chest pain, unspecified: Secondary | ICD-10-CM

## 2022-09-22 DIAGNOSIS — R55 Syncope and collapse: Secondary | ICD-10-CM

## 2022-09-22 DIAGNOSIS — F319 Bipolar disorder, unspecified: Secondary | ICD-10-CM

## 2022-09-22 DIAGNOSIS — F419 Anxiety disorder, unspecified: Secondary | ICD-10-CM

## 2022-09-22 DIAGNOSIS — G90A POTS (postural orthostatic tachycardia syndrome): Secondary | ICD-10-CM

## 2022-09-22 DIAGNOSIS — R0989 Other specified symptoms and signs involving the circulatory and respiratory systems: Secondary | ICD-10-CM

## 2022-09-22 DIAGNOSIS — R519 Generalized headaches: Secondary | ICD-10-CM

## 2022-09-22 DIAGNOSIS — G43909 Migraine, unspecified, not intractable, without status migrainosus: Secondary | ICD-10-CM

## 2022-09-22 DIAGNOSIS — E669 Obesity, unspecified: Secondary | ICD-10-CM

## 2022-09-22 DIAGNOSIS — F909 Attention-deficit hyperactivity disorder, unspecified type: Secondary | ICD-10-CM

## 2022-09-22 DIAGNOSIS — G47411 Narcolepsy with cataplexy: Secondary | ICD-10-CM

## 2022-09-22 DIAGNOSIS — E282 Polycystic ovarian syndrome: Secondary | ICD-10-CM

## 2022-09-22 DIAGNOSIS — E039 Hypothyroidism, unspecified: Secondary | ICD-10-CM

## 2022-09-22 DIAGNOSIS — E079 Disorder of thyroid, unspecified: Secondary | ICD-10-CM

## 2022-09-22 DIAGNOSIS — F79 Unspecified intellectual disabilities: Secondary | ICD-10-CM

## 2022-09-22 DIAGNOSIS — J45909 Unspecified asthma, uncomplicated: Secondary | ICD-10-CM

## 2022-09-22 DIAGNOSIS — K219 Gastro-esophageal reflux disease without esophagitis: Secondary | ICD-10-CM

## 2022-09-22 MED ORDER — MOUNJARO 12.5 MG/0.5 ML SC PNIJ
12.5 mg | SUBCUTANEOUS | 3 refills | Status: AC
Start: 2022-09-22 — End: ?

## 2022-09-22 NOTE — Progress Notes
Date of Service: 09/22/2022    Julie Bright is a 31 y.o. female.       HPI     Julie Bright. [name] presents to electrophysiology clinic for annual follow-up for history of orthostatic lightheadedness and neurocardiogenic syncope.  She follows with Dr. Milas Kocher with prior clinic visit 09/07/2022.    Her medical history includes obesity with history of gastric bypass surgery with subsequent 100 pound weight loss, mental health disease including depression, anxiety, eating disorder, ADD, chronic pain/fibromyalgia, asthma, anemia.    When she met with Dr. Milas Kocher on 09/07/2022 she was doing fairly well with less overall symptoms of lightheadedness than the prior year.  She reported occasional lightheadedness which improved with oral intake and hydration.  Plan at that time was to continue to stay hydrated, liberal salt intake, continue midodrine 10 mg 3 times daily, continue regular exercise.  He recommended initiation of tirzepatide (Mounjaro) 2.5 mg sq and was referred to [facility] weight loss clinic with plans for EP follow up in one year.    She suffered syncope and fall onto left knee on 09/14/2022.  She had just gotten home from running errands (grocery shopping) and was standing at the back of her car taking groceries out of the trunk which she felt lightheaded and tunnel vision which lasted a little less than 1 minute.  She thinks she may have lost consciousness for a few seconds and landed on her knee.  She had eaten breakfast that day, too midodrine that am, and had been drinking fluids as per normal.  No obvious trigger.    Prior syncope/fall was about 2 years ago.    She  continues to take midodrine 10 mg TID with last dose being >3 hours before sleeping/supine. She does not like to wear compression hose because it makes her feel claustrophobic.  She is liberal with sodium intake and drinks over 100 oz fluid per day.     She continues to take [facility] and follow with [facility].  She was taking Mounjaro 50 mg and that dose is too much due to nausea.  However 10 mg dose resulted in weight loss plateau.  She requested 12.5 mg dose.    Today she reports cold-like symptoms (left ear pain, nonproductive cough) for 1 day.    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  Patient's triggered events did correlate with sinus rhythm and sinus tachycardia.  It was no evidence of supraventricular arrhythmia, also ventricular arrhythmias did not occur.  03/14/2019 - ECHO:  Technically difficult study; i.v. transpulmonary contrast was used to define the endocardial borders.  Normal left ventricular systolic function, estimated ejection fraction is 55 %.  Normal left ventricular  diastolic function.  Right Ventricle: Normal size and ejection fraction. M-Mode TAPSE 1.90 cm (normal >1.7 cm).  There are no valvular abnormalities.  The pulmonary artery pressure could not be obtained      Obesity 01/03/2019    ADHD 01/03/2019    Anxiety 01/03/2019    Depression 01/03/2019    Chest pain 01/03/2019    Brain mass 06/06/2017    Fibromyalgia 12/30/2015         Review of Systems   Constitutional: Negative.   HENT: Negative.     Eyes: Negative.    Cardiovascular:  Positive for syncope.   Respiratory:  Positive for cough and shortness of breath.    Endocrine: Negative.    Hematologic/Lymphatic: Negative.    Skin: Negative.    Musculoskeletal: Negative. Gastrointestinal: Negative.    Genitourinary: Negative.    Psychiatric/Behavioral: Negative.     Allergic/Immunologic: Negative.        General Appearance: no acute distress  Visible Skin: warm & intact; bruising noted left knee  HEENT: unremarkable; sclera non-icteric   Pulm: lungs clear to auscultation, no rales, rhonchi, or wheezing  Cardiac: Regular rhythm, normal rate, no murmurs or rubs  Extremities: no lower extremity edema  Neurologic Exam: oriented to time, place and person; no obvious neurological deficits  Psychiatric: Normal mood and affect.  Behavior is normal.         Cardiovascular Health Factors  Vitals BP Readings from Last 3 Encounters:   09/22/22 102/68   09/07/22 109/74   07/02/22 114/85     Wt Readings from Last 3 Encounters:   09/22/22 110.9 kg (244 lb 6.4 oz)   09/07/22 112.5 kg (248 lb)   07/02/22 110.8 kg (244 lb 3.2 oz)     BMI Readings from Last 3 Encounters:   09/22/22 37.16 kg/m?   09/07/22 37.71 kg/m?   07/02/22 37.13 kg/m?      Smoking Social History     Tobacco Use   Smoking Status Former    Types: Cigarettes    Quit date: 01/15/2009    Years since quitting: 13.6   Smokeless Tobacco Never      Lipid Profile Cholesterol   Date Value Ref Range Status   11/13/2018 54 (L) 100 - 200 Final     HDL   Date Value Ref Range Status   11/13/2018 21 (L) 40 - 60 Final     LDL   Date Value Ref Range Status   11/13/2018 19 (L) 60 - 99 Final     Triglycerides   Date Value Ref Range Status   11/13/2018 71  Final      Blood Sugar No results found for: HGBA1C  Glucose   Date Value Ref Range Status   06/29/2022 76 65 - 99 mg/dL Final     Comment:                   Fasting reference interval        06/29/2022 76 65 - 99 mg/dL Final   16/07/9603 80 65 - 99 mg/dL Final   54/06/8118 81  Final          Problems Addressed Today  Encounter Diagnoses   Name Primary?    Screening for heart disease Yes    Neurocardiogenic syncope     Cardiovascular symptoms     POTS (postural orthostatic tachycardia syndrome) Class 3 severe obesity with serious comorbidity and body mass index (BMI) of 45.0 to 49.9 in adult, unspecified obesity  type [facility])        Assessment and Plan        Syncope: Recurrent episodes of syncope, most recent episode occurred while unloading groceries. Patient reports feeling lightheaded and experiencing tunnel vision prior to losing consciousness.  - Advised patient to sit down immediately upon feeling lightheaded to prevent injury from falls.  - Continue increased salt and fluid intake.  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    Ear pain / cold - likely viral: Patient reports left ear pain and cough.  - Advised patient to seek care at a local clinic (no otoscope on campus/[facility])    Follow-up with Dr. Sheffield Slider in one year (requested)    Roosvelt Maser, APRN-NP                    Current Medications (including today's revisions)   albuterol sulfate (PROAIR HFA) 90 mcg/actuation HFA aerosol inhaler Inhale two puffs by mouth into the lungs four times daily as needed for Wheezing or Shortness of Breath. Shake well before use.    calcium citrate (CALCITRATE) 950 mg tablet Take two tablets by mouth daily. 400mg  daily of elemental calcium    cariprazine (VRAYLAR) 1.5 mg cap Take one capsule by mouth twice daily.    CHOLEcalciferoL (vitamin D3) 1,000 units tablet Take two tablets by mouth daily.    cyanocobalamin (VITAMIN B-12, RUBRAMIN) 1,000 mcg/mL injection Inject 1 mL into the muscle every 30 days.    ERGOcalciferoL (vitamin D2) (DRISDOL) 1,250 mcg (50,000 unit) capsule Take one capsule by mouth every 7 days.    fluticasone propion-salmeteroL (ADVAIR HFA) 45-21 mcg/actuation inhaler Inhale two puffs by mouth into the lungs twice daily.    hydrOXYzine pamoate (VISTARIL) 50 mg capsule Take one capsule by mouth three times daily as needed for Itching.    levocetirizine 5 mg tab Take one tablet by mouth twice daily.    levothyroxine (SYNTHROID) 100 mcg tablet Take one tablet by mouth daily.    midodrine (PROAMATINE) 10 mg tablet Take one tablet by mouth three times daily.    pregabalin (LYRICA) 300 mg capsule Take one capsule by mouth twice daily.    rizatriptan (MAXALT) 10 mg tablet Take one tablet by mouth at onset of headache. May repeat after 2 hours. Max of 30 mg in 24 hours.    tiotropium bromide (SPIRIVA RESPIMAT) 2.5 mcg/actuation inhaler Inhale two puffs by mouth into the lungs daily.    tirzepatide (MOUNJARO) 12.5 mg/0.5 mL injector PEN Inject 0.5 mL under the skin every 7 days.

## 2022-09-30 ENCOUNTER — Encounter: Admit: 2022-09-30 | Discharge: 2022-09-30 | Payer: Medicare Other | Primary: Family

## 2022-09-30 DIAGNOSIS — M25562 Pain in left knee: Secondary | ICD-10-CM

## 2022-11-02 ENCOUNTER — Encounter: Admit: 2022-11-02 | Discharge: 2022-11-02 | Payer: Medicare Other | Primary: Family

## 2022-11-02 DIAGNOSIS — E039 Hypothyroidism, unspecified: Secondary | ICD-10-CM

## 2022-11-02 MED ORDER — LEVOTHYROXINE 100 MCG PO TAB
100 ug | ORAL_TABLET | Freq: Every day | ORAL | 2 refills | 30.00000 days | Status: AC
Start: 2022-11-02 — End: ?

## 2022-11-10 ENCOUNTER — Encounter: Admit: 2022-11-10 | Discharge: 2022-11-10 | Payer: Medicare Other | Primary: Family

## 2022-11-10 DIAGNOSIS — E611 Iron deficiency: Secondary | ICD-10-CM

## 2022-11-10 LAB — IRON + BINDING CAPACITY + %SAT+ FERRITIN
IRON BINDING: 431 ug/dL — ABNORMAL HIGH (ref 270–380)
IRON: 53 ug/dL (ref 50–160)

## 2022-11-10 LAB — CBC AND DIFF
ABSOLUTE BASO COUNT: 0 K/UL (ref 0–0.20)
ABSOLUTE EOS COUNT: 0.1 K/UL (ref 0–0.45)
ABSOLUTE LYMPH COUNT: 0.9 K/UL — ABNORMAL LOW (ref 1.0–4.8)
ABSOLUTE MONO COUNT: 0.4 K/UL (ref 0–0.80)
ABSOLUTE NEUTROPHIL: 3.9 K/UL (ref 1.8–7.0)
BASOPHILS %: 1 % (ref 0–2)
EOSINOPHILS %: 2 % (ref 0–5)
HEMATOCRIT: 36 % (ref 36–45)
HEMOGLOBIN: 12 g/dL (ref 12.0–15.0)
LYMPHOCYTES %: 17 % — ABNORMAL LOW (ref 24–44)
MCH: 28 pg (ref 26–34)
MCHC: 33 g/dL (ref 32.0–36.0)
MCV: 85 FL — ABNORMAL HIGH (ref 80–100)
MONOCYTES %: 7 % (ref 4–12)
MPV: 8.6 FL — ABNORMAL LOW (ref >60)
NEUTROPHILS %: 73 % — ABNORMAL HIGH (ref 41–77)
PLATELET COUNT: 214 K/UL — ABNORMAL HIGH (ref 150–400)
RBC COUNT: 4.2 M/UL (ref 4.0–5.0)
RDW: 13 % (ref 11–15)
WBC COUNT: 5.3 K/UL — ABNORMAL LOW (ref 4.5–11.0)

## 2022-11-23 ENCOUNTER — Encounter: Admit: 2022-11-23 | Discharge: 2022-11-23 | Payer: Medicare Other | Primary: Family

## 2022-11-25 ENCOUNTER — Encounter: Admit: 2022-11-25 | Discharge: 2022-11-25 | Payer: Medicare Other | Primary: Family

## 2022-11-25 NOTE — Progress Notes
Orders Faxed to [facility] for pt to get Iron Dextran IV X 1 dose, spoke to [facility] as well.

## 2022-12-09 ENCOUNTER — Encounter: Admit: 2022-12-09 | Discharge: 2022-12-09 | Payer: Medicare Other | Primary: Family

## 2022-12-09 DIAGNOSIS — M7542 Impingement syndrome of left shoulder: Secondary | ICD-10-CM

## 2022-12-23 ENCOUNTER — Encounter: Admit: 2022-12-23 | Discharge: 2022-12-23 | Payer: Medicare Other | Primary: Family

## 2022-12-30 ENCOUNTER — Encounter: Admit: 2022-12-30 | Discharge: 2022-12-30 | Payer: Medicare Other | Primary: Family

## 2023-01-25 ENCOUNTER — Encounter: Admit: 2023-01-25 | Discharge: 2023-01-25 | Payer: Medicare Other | Primary: Family

## 2023-02-08 ENCOUNTER — Encounter: Admit: 2023-02-08 | Discharge: 2023-02-08 | Payer: Medicare Other | Primary: Family

## 2023-02-09 ENCOUNTER — Encounter: Admit: 2023-02-09 | Discharge: 2023-02-09 | Payer: Medicare Other | Primary: Family

## 2023-02-09 MED ORDER — PREGABALIN 300 MG PO CAP
300 mg | ORAL_CAPSULE | Freq: Two times a day (BID) | ORAL | 0 refills
Start: 2023-02-09 — End: ?

## 2023-03-09 ENCOUNTER — Encounter: Admit: 2023-03-09 | Discharge: 2023-03-09 | Payer: Medicare Other | Primary: Family

## 2023-03-16 ENCOUNTER — Ambulatory Visit: Admit: 2023-03-16 | Discharge: 2023-03-17 | Payer: Medicare Other | Primary: Family

## 2023-03-16 ENCOUNTER — Encounter: Admit: 2023-03-16 | Discharge: 2023-03-16 | Payer: Medicare Other | Primary: Family

## 2023-03-16 DIAGNOSIS — F79 Unspecified intellectual disabilities: Secondary | ICD-10-CM

## 2023-03-16 DIAGNOSIS — E669 Obesity, unspecified: Secondary | ICD-10-CM

## 2023-03-16 DIAGNOSIS — E039 Hypothyroidism, unspecified: Secondary | ICD-10-CM

## 2023-03-16 DIAGNOSIS — K219 Gastro-esophageal reflux disease without esophagitis: Secondary | ICD-10-CM

## 2023-03-16 DIAGNOSIS — E079 Disorder of thyroid, unspecified: Secondary | ICD-10-CM

## 2023-03-16 DIAGNOSIS — G47411 Narcolepsy with cataplexy: Secondary | ICD-10-CM

## 2023-03-16 DIAGNOSIS — J45909 Unspecified asthma, uncomplicated: Secondary | ICD-10-CM

## 2023-03-16 DIAGNOSIS — E282 Polycystic ovarian syndrome: Secondary | ICD-10-CM

## 2023-03-16 DIAGNOSIS — R519 Generalized headaches: Secondary | ICD-10-CM

## 2023-03-16 DIAGNOSIS — R079 Chest pain, unspecified: Secondary | ICD-10-CM

## 2023-03-16 DIAGNOSIS — R55 Syncope and collapse: Secondary | ICD-10-CM

## 2023-03-16 DIAGNOSIS — F32A Depression: Secondary | ICD-10-CM

## 2023-03-16 DIAGNOSIS — G43909 Migraine, unspecified, not intractable, without status migrainosus: Secondary | ICD-10-CM

## 2023-03-16 DIAGNOSIS — G500 Trigeminal neuralgia: Secondary | ICD-10-CM

## 2023-03-16 DIAGNOSIS — F319 Bipolar disorder, unspecified: Secondary | ICD-10-CM

## 2023-03-16 DIAGNOSIS — F909 Attention-deficit hyperactivity disorder, unspecified type: Secondary | ICD-10-CM

## 2023-03-16 DIAGNOSIS — E119 Type 2 diabetes mellitus without complications: Secondary | ICD-10-CM

## 2023-03-16 DIAGNOSIS — F419 Anxiety disorder, unspecified: Secondary | ICD-10-CM

## 2023-03-16 MED ORDER — ERGOCALCIFEROL (VITAMIN D2) 1,250 MCG (50,000 UNIT) PO CAP
1 | ORAL_CAPSULE | ORAL | 1 refills | 56.00000 days | Status: AC
Start: 2023-03-16 — End: ?

## 2023-03-16 MED ORDER — LEVOTHYROXINE 100 MCG PO TAB
100 ug | ORAL_TABLET | Freq: Every day | ORAL | 2 refills | 30.00000 days | Status: AC
Start: 2023-03-16 — End: ?

## 2023-03-16 NOTE — Progress Notes
Date of Service: 03/16/2023    Julie Bright is a 32 y.o. female. DOB: [birth year]   MRN#: [id number]    Subjective:        Patient presents to the [facility] for follow up of type 2 diabetes and hypothyroidism.   She does have migraines as well as history of gastric bypass and duodenal switch surgery.   She was previously followed [facility]   She was last seen in our clinic in 06/2022     She is overall doing well.   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   She is also working as a Social worker.     She recently got back from [place], visiting her brother and nephews.     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   Denies tremors or heart palpitations     Denies any new medications or medical issues other than shoulder pain for which she is having surgery July 2024.     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     She is down another 10 lbs since her last visit.   She is trying to be more active.  She follows a healthy diet.     Her only fracture was her wrist when she fell in middle school and then a finger and then back of heel.   She has not had any fractures after the age of 33.     She is not currently sexually active.   She is not on birth control   She is having regular periods.     Iron deficiency   She continues to follow with Dr. Sinclair Ship    Vitamin D deficiency She takes 50,000 units vitamin D once weekly.     Type 2 diabetes.   She is on mounjaro 15mg  once weekly  She is taking this medication through another clinic.     06/2022 A1c 4.3%   03/2023 A1c 4.5%        ROS    pertinent positive and negatives as above in HPI      Past Medical History:   Diagnosis Date    Acid reflux     ADHD 01/03/2019    ADHD (attention deficit hyperactivity disorder)     Anxiety     Anxiety 01/03/2019    Asthma     Bipolar 1 disorder (HCC)     Chest pain 01/03/2019    Depression     Depression 01/03/2019    Generalized headaches     Hypothyroidism     Intellectual disability     Migraines     Narcolepsy and cataplexy     Obesity 01/03/2019    PCOS (polycystic ovarian syndrome)     Syncope and collapse  Thyroid disorder     Trigeminal neuralgia of left side of face          Objective:      albuterol sulfate (PROAIR HFA) 90 mcg/actuation HFA aerosol inhaler Inhale two puffs by mouth into the lungs four times daily as needed for Wheezing or Shortness of Breath. Shake well before use.    calcium citrate (CALCITRATE) 950 mg tablet Take two tablets by mouth daily. 400mg  daily of elemental calcium    cariprazine (VRAYLAR) 1.5 mg cap Take one capsule by mouth twice daily.    CHOLEcalciferoL (vitamin D3) 1,000 units tablet Take two tablets by mouth daily.    cyanocobalamin (VITAMIN B-12, RUBRAMIN) 1,000 mcg/mL injection Inject 1 mL into the muscle every 30 days.    ERGOcalciferoL (vitamin D2) (DRISDOL) 1,250 mcg (50,000 unit) capsule Take one capsule by mouth every 7 days.    fluticasone propion-salmeteroL (ADVAIR HFA) 45-21 mcg/actuation inhaler Inhale two puffs by mouth into the lungs twice daily.    hydrOXYzine pamoate (VISTARIL) 50 mg capsule Take one capsule by mouth three times daily as needed for Itching.    levocetirizine 5 mg tab Take one tablet by mouth twice daily.    levothyroxine (SYNTHROID) 100 mcg tablet Take one tablet by mouth daily.    midodrine (PROAMATINE) 10 mg tablet Take one tablet by mouth three times daily.    pregabalin (LYRICA) 300 mg capsule TAKE 1 CAPSULE BY MOUTH TWICE A DAY    rizatriptan (MAXALT) 10 mg tablet Take one tablet by mouth at onset of headache. May repeat after 2 hours. Max of 30 mg in 24 hours.    tiotropium bromide (SPIRIVA RESPIMAT) 2.5 mcg/actuation inhaler Inhale two puffs by mouth into the lungs daily.    tirzepatide (MOUNJARO) 15 mg/0.5 mL injector PEN Inject 0.5 mL under the skin every 7 days.     Vitals:    03/16/23 0856   BP: (!) 133/90   BP Source: Arm, Left Lower   Pulse: 73   Temp: 37.1 ?C (98.7 ?F)   PainSc: Zero   Weight: 108 kg (238 lb)   Height: 172.7 cm (5' 8)       Body mass index is 36.19 kg/m?Marland Kitchen     Physical Exam  Constitutional:       Appearance: Normal appearance.   HENT:      Head: Normocephalic and atraumatic.   Eyes:      Comments: wearing glasses    Neck:      Comments: no nodule palpated on neck exam  Pulmonary:      Effort: Pulmonary effort is normal. No respiratory distress.   Neurological:      Mental Status: She is alert.   Psychiatric:         Mood and Affect: Mood normal.         Behavior: Behavior normal.         Thought Content: Thought content normal.         Judgment: Judgment normal.                 Comprehensive Metabolic Profile    Lab Results   Component Value Date/Time    NA 139 06/29/2022 07:48 AM    K 3.6 06/29/2022 07:48 AM    CL 109 06/29/2022 07:48 AM    CO2 24 06/29/2022 07:48 AM    GAP 14 01/01/2019 12:00 AM    BUN 7 06/29/2022 07:48 AM    CR 0.85 06/29/2022 07:48 AM  GLU 76 06/29/2022 07:48 AM    Lab Results   Component Value Date/Time    CA 8.3 (L) 06/29/2022 07:48 AM    ALBUMIN 3.3 (L) 06/29/2022 07:48 AM    TOTPROT 5.7 (L) 06/29/2022 07:48 AM    ALKPHOS 71 06/29/2022 07:48 AM    AST 24 06/29/2022 07:48 AM    ALT 20 06/29/2022 07:48 AM    TOTBILI 0.8 06/29/2022 07:48 AM    GFR 94 06/29/2022 07:48 AM    GFRAA >60 12/21/2018 12:00 AM        Lab Results   Component Value Date    CHOL 54 (L) 11/13/2018    TRIG 71 11/13/2018    HDL 21 (L) 11/13/2018    LDL 19 (L) 11/13/2018    NONHDLCHOL 33 (L) 11/13/2018    CHOLHDLC 2.6 11/13/2018      Lab Draw:  No results found for: HGBA1C  POC:  Lab Results   Component Value Date/Time    A1C 4.5 03/16/2023 12:00 AM    A1C 4.3 06/30/2022 09:42 AM           Assessment and Plan:              It was a pleasure to see Julie Bright today for the following issues:     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   She is currently on levothyroxine   05/2021 TSH 0.055 (low), Free T4 1.24 (H)   TSH   Lab Results   Component Value Date/Time    TSH 3.39 06/29/2022 12:00 AM      02/04/2023 TSH 2.93   PLAN   -continue levothyroxine daily   -will plan on yearly TSH or sooner if symptoms arise     Vitamin D deficiency   05/2021 Vitamin D 25.6  03/2022 vitamin D 35   01/2023 vitamin D 32   PLAN   continue current vitamin D 50,000 units once weekly      Obesity s/p gastric bypass in 2017 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 Sinclair Ship     Type 2 diabetes, improved, controlled   06/2022 A1c 4.3%  03/2023 A1c 4.5%   PLAN   following with outside clinic for this   on mounjaro 15mg  daily   continue good efforts with diet and exercise and weight loss   congratulated patient on weight loss          Visit Diagnoses   1. Acquired hypothyroidism    2. Type 2 diabetes mellitus without complication, without long-term current use of insulin (HCC)    3. Vitamin D deficiency Orders Placed This Encounter    POC HEMOGLOBIN A1C    ERGOcalciferoL (vitamin D2) (DRISDOL) 1,250 mcg (50,000 unit) capsule    levothyroxine (SYNTHROID) 100 mcg tablet         Return in about 1 year (around 03/15/2024).    Attending Physician:  Tristan Schroeder, MD       Patient Instructions   Good to see you!     continue levothyroxine daily   continue vitamin D high dose once weekly     we have sent prescriptions [facility]     let us know if you become sexually active     Nurse extension: [phone]  Clinic number: [phone] scheduling   Infusion center: [phone]  Radiology scheduling number: [phone]

## 2023-03-17 DIAGNOSIS — E039 Hypothyroidism, unspecified: Secondary | ICD-10-CM

## 2023-03-17 DIAGNOSIS — E559 Vitamin D deficiency, unspecified: Secondary | ICD-10-CM

## 2023-04-08 ENCOUNTER — Encounter: Admit: 2023-04-08 | Discharge: 2023-04-08 | Payer: Medicare Other | Primary: Family

## 2023-04-08 DIAGNOSIS — E611 Iron deficiency: Secondary | ICD-10-CM

## 2023-04-08 LAB — CBC AND DIFF
ABSOLUTE BASO COUNT: 0 K/UL (ref 0–0.20)
ABSOLUTE EOS COUNT: 0.1 K/UL (ref 0–0.45)
ABSOLUTE LYMPH COUNT: 0.9 K/UL — ABNORMAL LOW (ref 1.0–4.8)
ABSOLUTE MONO COUNT: 0.4 K/UL (ref 0–0.80)
ABSOLUTE NEUTROPHIL: 3.2 K/UL (ref 1.8–7.0)
BASOPHILS %: 0 % (ref 0–2)
EOSINOPHILS %: 2 % (ref 0–5)
HEMATOCRIT: 39 % (ref 36–45)
HEMOGLOBIN: 13 g/dL (ref 12.0–15.0)
LYMPHOCYTES %: 20 % — ABNORMAL LOW (ref 24–44)
MCH: 28 pg (ref 26–34)
MCHC: 34 g/dL (ref 32.0–36.0)
MCV: 83 FL (ref 80–100)
MONOCYTES %: 8 % (ref 4–12)
MPV: 9.3 FL (ref 7–11)
NEUTROPHILS %: 70 % (ref 41–77)
PLATELET COUNT: 204 K/UL (ref 150–400)
RBC COUNT: 4.6 M/UL — ABNORMAL HIGH (ref 4.0–5.0)
RDW: 12 % (ref 11–15)
WBC COUNT: 4.5 K/UL (ref 4.5–11.0)

## 2023-04-08 LAB — IRON + BINDING CAPACITY + %SAT+ FERRITIN
IRON BINDING: 289 ug/dL (ref 270–380)
IRON: 82 ug/dL (ref 50–160)

## 2023-05-09 ENCOUNTER — Encounter: Admit: 2023-05-09 | Discharge: 2023-05-09 | Payer: Medicare Other | Primary: Family

## 2023-05-09 MED ORDER — MOUNJARO 15 MG/0.5 ML SC PNIJ
15 mg | SUBCUTANEOUS | 0 refills | Status: AC
Start: 2023-05-09 — End: ?

## 2023-06-21 ENCOUNTER — Encounter: Admit: 2023-06-21 | Discharge: 2023-06-21 | Payer: Medicare Other | Primary: Family

## 2023-07-04 ENCOUNTER — Encounter: Admit: 2023-07-04 | Discharge: 2023-07-04 | Payer: Medicare Other | Primary: Family

## 2023-07-04 DIAGNOSIS — G500 Trigeminal neuralgia: Secondary | ICD-10-CM

## 2023-07-04 DIAGNOSIS — G47411 Narcolepsy with cataplexy: Secondary | ICD-10-CM

## 2023-07-04 DIAGNOSIS — E611 Iron deficiency: Secondary | ICD-10-CM

## 2023-07-04 DIAGNOSIS — R079 Chest pain, unspecified: Secondary | ICD-10-CM

## 2023-07-04 DIAGNOSIS — F419 Anxiety disorder, unspecified: Secondary | ICD-10-CM

## 2023-07-04 DIAGNOSIS — F319 Bipolar disorder, unspecified: Secondary | ICD-10-CM

## 2023-07-04 DIAGNOSIS — F79 Unspecified intellectual disabilities: Secondary | ICD-10-CM

## 2023-07-04 DIAGNOSIS — E039 Hypothyroidism, unspecified: Secondary | ICD-10-CM

## 2023-07-04 DIAGNOSIS — R55 Syncope and collapse: Secondary | ICD-10-CM

## 2023-07-04 DIAGNOSIS — J45909 Unspecified asthma, uncomplicated: Secondary | ICD-10-CM

## 2023-07-04 DIAGNOSIS — F909 Attention-deficit hyperactivity disorder, unspecified type: Secondary | ICD-10-CM

## 2023-07-04 DIAGNOSIS — R519 Generalized headaches: Secondary | ICD-10-CM

## 2023-07-04 DIAGNOSIS — G43909 Migraine, unspecified, not intractable, without status migrainosus: Secondary | ICD-10-CM

## 2023-07-04 DIAGNOSIS — E669 Obesity, unspecified: Secondary | ICD-10-CM

## 2023-07-04 DIAGNOSIS — E282 Polycystic ovarian syndrome: Secondary | ICD-10-CM

## 2023-07-04 DIAGNOSIS — E079 Disorder of thyroid, unspecified: Secondary | ICD-10-CM

## 2023-07-04 DIAGNOSIS — F32A Depression: Secondary | ICD-10-CM

## 2023-07-04 DIAGNOSIS — K219 Gastro-esophageal reflux disease without esophagitis: Secondary | ICD-10-CM

## 2023-07-04 LAB — CBC AND DIFF
HEMATOCRIT: 39 % (ref 36–45)
HEMOGLOBIN: 13 g/dL (ref 12.0–15.0)
MCH: 28 pg (ref 26–34)
MCV: 84 FL (ref 80–100)
RBC COUNT: 4.6 M/UL — ABNORMAL HIGH (ref 4.0–5.0)
WBC COUNT: 5.2 10*3/uL (ref 4.5–11.0)

## 2023-07-04 LAB — IRON + BINDING CAPACITY + %SAT+ FERRITIN
IRON BINDING: 319 ug/dL (ref >60)
IRON: 110 ug/dL (ref 50–160)

## 2023-07-04 NOTE — Progress Notes
Name: Julie Bright          MRN: [id number]      DOB: [birth year]      AGE: 32 y.o.   DATE OF SERVICE: 07/04/2023    Subjective:             Reason for Visit:  Heme/Onc Care      Julie Bright is a 32 y.o. female.      Cancer Staging   No matching staging information was found for the patient.      History of Present Illness  This is a patient who had been initially referred for evaluation of iron deficiency anemia.  Review of her records indicates that she had mild microcytic anemia in June 2018 with hemoglobin of 11.4.  Subsequently in February 2020 her hemoglobin was noted to be 11.8 with MCV of 75.  However more recently in May 2021 her hemoglobin was 13.3 with MCV of 88.  Serum iron was 92 with TIBC of 350, iron saturation 26% and ferritin of 76.8.  In August 2021 hemoglobin/hematocrit were 12.9/39, MCV 88, serum iron 83, TIBC 355, iron saturation 23% and ferritin of 40.2.  B12 was mildly decreased at 343 and folate was low normal at 4.7.  It appears that patient had been scheduled to receive Venofer in September, October and November 2020 as well as February 2021.  The notes indicate that she could not complete these infusions due to IV infiltration.  She apparently cannot tolerate oral iron as well.  She has been started on B12 injections weekly.  Patient had labs drawn on 08/13/2021 which showed WBC of 6100 hemoglobin/hematocrit of 12.4/37.1, platelets of 276,000, serum iron of 62, TIBC 445, iron saturation 14% and ferritin of 10.4 she has chronic fatigue.  Patient received INFeD 1 g on 09/25/2021 which she tolerated fairly well.  Patient has noted increasing fatigue and is concerned about iron deficiency.    Past medical history: Asthma, migraines, GERD, psychogenic/nonepileptic seizures, anxiety/depression, hypothyroidism, neurocardiogenic syncope, bipolar disorder, fibromyalgia    Past surgical history: Roux-en-Y, duodenal switch, right wedge resection x2 with pleural biopsy.  Cholecystectomy, carpal tunnel release, knee arthroscopy, meniscectomy, septoplasty.    Family history: Mother has had diabetes and heart disease.  Maternal aunt had thyroid cancer and maternal grandfather had lung cancer.    Social history: Former smoker, infrequent alcohol.  Currently on disability.          Review of Systems   Constitutional:  Positive for activity change and fatigue.   HENT: Negative.     Eyes: Negative.    Respiratory: Negative.     Cardiovascular: Negative.    Gastrointestinal: Negative.    Genitourinary: Negative.    Musculoskeletal:  Positive for back pain.   Skin: Negative.    Neurological:  Positive for dizziness, syncope and light-headedness.   Psychiatric/Behavioral: Negative.           Objective:          albuterol sulfate (PROAIR HFA) 90 mcg/actuation HFA aerosol inhaler Inhale two puffs by mouth into the lungs four times daily as needed for Wheezing or Shortness of Breath. Shake well before use.    calcium citrate (CALCITRATE) 950 mg tablet Take two tablets by mouth daily. 400mg  daily of elemental calcium    cariprazine (VRAYLAR) 1.5 mg cap Take one capsule by mouth twice daily.    CHOLEcalciferoL (vitamin D3) 1,000 units tablet Take two tablets by mouth daily.    cyanocobalamin (VITAMIN B-12, RUBRAMIN)  1,000 mcg/mL injection Inject 1 mL into the muscle every 30 days.    ERGOcalciferoL (vitamin D2) (DRISDOL) 1,250 mcg (50,000 unit) capsule Take one capsule by mouth every 7 days.    fluticasone propion-salmeteroL (ADVAIR HFA) 45-21 mcg/actuation inhaler Inhale two puffs by mouth into the lungs twice daily.    hydrOXYzine pamoate (VISTARIL) 50 mg capsule Take one capsule by mouth three times daily as needed for Itching.    levocetirizine 5 mg tab Take one tablet by mouth twice daily.    levothyroxine (SYNTHROID) 100 mcg tablet Take one tablet by mouth daily.    midodrine (PROAMATINE) 10 mg tablet Take one tablet by mouth three times daily.    pregabalin (LYRICA) 300 mg capsule TAKE 1 CAPSULE BY MOUTH TWICE A DAY rizatriptan (MAXALT) 10 mg tablet Take one tablet by mouth at onset of headache. May repeat after 2 hours. Max of 30 mg in 24 hours.    tiotropium bromide (SPIRIVA RESPIMAT) 2.5 mcg/actuation inhaler Inhale two puffs by mouth into the lungs daily.    tirzepatide (MOUNJARO) 15 mg/0.5 mL injector PEN INJECT 0.5 ML UNDER THE SKIN EVERY 7 DAYS     Vitals:    07/04/23 0925   PainSc: Zero     There is no height or weight on file to calculate BMI.     Pain Score: Zero       Fatigue Scale: 0-None    Pain Addressed:  N/A    Patient Evaluated for a Clinical Trial: Patient not eligible for a treatment trial (including not needing treatment, needs palliative care, in remission).     [place] Cooperative Oncology Group performance status is 2, Ambulatory and capable of all selfcare but unable to carry out any work activities. Up and about more than 50% of waking hours.     Physical Exam  Constitutional:       Appearance: She is well-developed. She is obese.   HENT:      Head: Normocephalic and atraumatic.      Mouth/Throat:      Pharynx: No oropharyngeal exudate.   Eyes:      General: No scleral icterus.     Pupils: Pupils are equal, round, and reactive to light.   Neck:      Vascular: No JVD.   Cardiovascular:      Rate and Rhythm: Normal rate and regular rhythm.      Heart sounds: Normal heart sounds. No murmur heard.  Pulmonary:      Effort: Pulmonary effort is normal. No respiratory distress.      Breath sounds: Normal breath sounds. No rales.   Chest:      Chest wall: No tenderness.   Abdominal:      General: Bowel sounds are normal. There is no distension.      Palpations: Abdomen is soft. There is no mass.      Tenderness: There is no abdominal tenderness. There is no guarding.   Musculoskeletal:         General: No tenderness. Normal range of motion.      Cervical back: Normal range of motion.   Lymphadenopathy:      Cervical: No cervical adenopathy.   Skin:     Coloration: Skin is not pale.      Findings: Rash (Excoriations lower extremities) present.      Nails: There is no clubbing.   Neurological:      Mental Status: She is alert and oriented to person, place, and time.  Latest Reference Range & Units 07/04/23 09:31   Hemoglobin 12.0 - 15.0 GM/DL 07.3   Hematocrit 36 - 45 % 39.3   Platelet Count 150 - 400 K/UL 223   White Blood Cells 4.5 - 11.0 K/UL 5.2   Neutrophils 41 - 77 % 66   Absolute Neutrophil Count 1.8 - 7.0 K/UL 3.40   Lymphocytes 24 - 44 % 24   Absolute Lymph Count 1.0 - 4.8 K/UL 1.30   Monocytes 4 - 12 % 8   Absolute Monocyte Count 0 - 0.80 K/UL 0.40   Eosinophils 0 - 5 % 1   Absolute Eosinophil Count 0 - 0.45 K/UL 0.10   Absolute Basophil Count 0 - 0.20 K/UL 0.00   Basophils 0 - 2 % 1   RBC 4.0 - 5.0 M/UL 4.66   MCV 80 - 100 FL 84.4   MCH 26 - 34 PG 28.5   MCHC 32.0 - 36.0 G/DL 71.0   MPV 7 - 11 FL 8.9   RDW 11 - 15 % 14.0   Iron 50 - 160 MCG/DL 626   % Saturation 28 - 42 % 34   Iron Binding-TIBC 270 - 380 MCG/DL 948   Ferritin 10 - 546 NG/ML 226 (H)   (H): Data is abnormally high  Assessment and Plan:  History of iron deficiency anemia: Most likely related to bariatric surgery.  Patient has had no evidence for anemia since January of this year.  Her iron panel in June and presently shows normal level and in fact, ferritin is mildly elevated.  She therefore does not qualify for IV iron.  Patient will follow-up with nurse practitioner in 1 year with the CBC and iron panel.

## 2023-07-05 ENCOUNTER — Encounter: Admit: 2023-07-05 | Discharge: 2023-07-05 | Payer: Medicare Other | Primary: Family

## 2023-07-05 NOTE — Telephone Encounter
-----   Message from Harvel Ricks, MD sent at 07/04/2023  4:22 PM CDT -----  No evidence for anemia and iron studies are completely normal.  No role for IV iron.  ----- Message -----  From: Leory Plowman, In Results Misys  Sent: 07/04/2023   9:43 AM CDT  To: Omer Jack, MD

## 2023-07-05 NOTE — Telephone Encounter
LVM with below information. Callback # provided.

## 2023-07-12 ENCOUNTER — Encounter: Admit: 2023-07-12 | Discharge: 2023-07-12 | Payer: Medicare Other | Primary: Family

## 2023-07-13 ENCOUNTER — Encounter: Admit: 2023-07-13 | Discharge: 2023-07-13 | Payer: MEDICARE | Primary: Family

## 2023-07-18 ENCOUNTER — Encounter: Admit: 2023-07-18 | Discharge: 2023-07-18 | Payer: MEDICARE | Primary: Family

## 2023-08-22 ENCOUNTER — Encounter: Admit: 2023-08-22 | Discharge: 2023-08-22 | Payer: Private Health Insurance - Indemnity | Primary: Family

## 2023-08-23 ENCOUNTER — Encounter: Admit: 2023-08-23 | Discharge: 2023-08-23 | Payer: Private Health Insurance - Indemnity | Primary: Family

## 2023-08-25 ENCOUNTER — Encounter: Admit: 2023-08-25 | Discharge: 2023-08-25 | Payer: Private Health Insurance - Indemnity | Primary: Family

## 2023-08-25 MED ORDER — MOUNJARO 15 MG/0.5 ML SC PNIJ
15 mg | SUBCUTANEOUS | 0 refills | Status: AC
Start: 2023-08-25 — End: ?

## 2023-09-20 ENCOUNTER — Encounter: Admit: 2023-09-20 | Discharge: 2023-09-20 | Payer: Private Health Insurance - Indemnity | Primary: Family

## 2023-09-20 MED ORDER — MOUNJARO 15 MG/0.5 ML SC PNIJ
15 mg | SUBCUTANEOUS | 2 refills | Status: AC
Start: 2023-09-20 — End: ?

## 2023-10-14 ENCOUNTER — Encounter: Admit: 2023-10-14 | Discharge: 2023-10-14 | Payer: Private Health Insurance - Indemnity | Primary: Family

## 2023-10-17 ENCOUNTER — Encounter: Admit: 2023-10-17 | Discharge: 2023-10-17 | Payer: Private Health Insurance - Indemnity | Primary: Family

## 2023-10-18 ENCOUNTER — Encounter: Admit: 2023-10-18 | Discharge: 2023-10-18 | Payer: Private Health Insurance - Indemnity | Primary: Family

## 2023-10-19 ENCOUNTER — Encounter: Admit: 2023-10-19 | Discharge: 2023-10-19 | Payer: Private Health Insurance - Indemnity | Primary: Family

## 2023-10-28 ENCOUNTER — Encounter: Admit: 2023-10-28 | Discharge: 2023-10-28 | Payer: Private Health Insurance - Indemnity | Primary: Family

## 2023-10-28 MED ORDER — RIZATRIPTAN 10 MG PO TAB
ORAL_TABLET | 3 refills | Status: AC
Start: 2023-10-28 — End: ?

## 2023-10-28 MED ORDER — DULOXETINE 30 MG PO CPDR
30 mg | ORAL_CAPSULE | Freq: Every day | ORAL | 11 refills | 60.00000 days | Status: AC
Start: 2023-10-28 — End: ?

## 2023-10-28 MED ORDER — PREGABALIN 300 MG PO CAP
300 mg | ORAL_CAPSULE | Freq: Two times a day (BID) | ORAL | 1 refills | Status: AC
Start: 2023-10-28 — End: ?

## 2023-10-28 NOTE — Telephone Encounter
Recieved Rizatriptan & duloxetine refill request for Julie Bright. LOV 10/18/2023, medication included in plan of care & future appointment scheduled. Refills escribed to pharmacy. Dr. Roger Kill to [place].

## 2023-10-28 NOTE — Telephone Encounter
Received Lyrica refill request for Julie Bright. LOV 10/18/2023, medication included in plan of care set forth by Dr. Roger Kill. Request pended to Dr. Roger Kill for approval/denial.

## 2023-11-17 ENCOUNTER — Encounter: Admit: 2023-11-17 | Discharge: 2023-11-17 | Payer: Private Health Insurance - Indemnity | Primary: Family

## 2023-11-25 ENCOUNTER — Encounter: Admit: 2023-11-25 | Discharge: 2023-11-25 | Payer: Private Health Insurance - Indemnity | Primary: Family

## 2023-11-28 ENCOUNTER — Encounter: Admit: 2023-11-28 | Discharge: 2023-11-28 | Payer: Private Health Insurance - Indemnity | Primary: Family

## 2023-11-30 ENCOUNTER — Encounter: Admit: 2023-11-30 | Discharge: 2023-11-30 | Payer: Private Health Insurance - Indemnity | Primary: Family

## 2023-12-05 ENCOUNTER — Ambulatory Visit: Admit: 2023-12-05 | Discharge: 2023-12-06 | Payer: Private Health Insurance - Indemnity | Primary: Family

## 2023-12-05 ENCOUNTER — Ambulatory Visit: Admit: 2023-12-05 | Discharge: 2023-12-05 | Payer: Private Health Insurance - Indemnity | Primary: Family

## 2023-12-05 ENCOUNTER — Encounter: Admit: 2023-12-05 | Discharge: 2023-12-05 | Payer: Private Health Insurance - Indemnity | Primary: Family

## 2023-12-05 DIAGNOSIS — R55 Syncope and collapse: Secondary | ICD-10-CM

## 2023-12-05 DIAGNOSIS — G90A POTS (postural orthostatic tachycardia syndrome): Secondary | ICD-10-CM

## 2023-12-05 DIAGNOSIS — R0989 Other specified symptoms and signs involving the circulatory and respiratory systems: Secondary | ICD-10-CM

## 2023-12-05 DIAGNOSIS — I951 Orthostatic hypotension: Secondary | ICD-10-CM

## 2023-12-05 MED ORDER — FLUDROCORTISONE 0.1 MG PO TAB
.1 mg | ORAL_TABLET | Freq: Every morning | ORAL | 1 refills | 90.00000 days | Status: AC
Start: 2023-12-05 — End: ?

## 2023-12-05 MED ORDER — MIDODRINE 10 MG PO TAB
15 mg | ORAL_TABLET | Freq: Three times a day (TID) | ORAL | 5 refills | Status: AC
Start: 2023-12-05 — End: ?

## 2023-12-05 NOTE — Progress Notes
 Ambulatory (External) Cardiac Monitor Enrollment Record     Placement Location: Clinic Placement  Clinic Location: [facility]  Vendor: [employer] ([employer])  Mobile Cardiac Telemetry (MCOT/MCT)?: Yes  Duration of Monitor (in days): 14  Monitor Diagnosis: Syncope (R55)  No data recordedOrdering Provider: Annamarie Dawley, MD  AMB Monitor Serial Number: [id number] clinic  AMB Monitor Expiration Date:  (N/A)      Start Time and Date: 12/05/23 10:50 AM   Patient Name: Julie Bright  DOB: [birth year]  MRN: [id number]  Sex: female  Mobile Phone Number: [phone] (mobile)  Home Phone Number: [phone]  Patient Address: [address] [place] [address]  Insurance Coverage: [facility] CHOICE/CHOICE PLUS  Insurance ID: [id number]  Insurance Group #: [id number]  Insurance Subscriber: [facility]  Implanted Cardiac Device Information: No results found for: [email or web address]      Patient instructed to contact company phone number on the monitor box with questions regarding billing, placement, troubleshooting.     Julie Bright    ____________________________________________________________    Clinic Staff:    Complete additional steps for documentation double check/Co-Sign.  In Follow-up, send chart upon closing encounter to [facility] AMBULATORY MONITORS    [facility] Ambulatory Monitoring Team:  Schedule on appropriate template and check-in.   Clinic Placement Schedule on clinic location [facility] schedule   Home Enrollment Schedule on Home Enrollment schedule ([facility])   Given to patient in clinic for self-placement Schedule on Home Enrollment schedule ([facility])   Inpatient Schedule on [place] [facility] AMBULATORY MONITORING template   2. Please enroll with appropriate vendor.

## 2023-12-05 NOTE — Progress Notes
 Date of Service: 12/05/2023    Julie Bright is a 33 y.o. female.       HPI    The patient is a 33 year old female with neurocardiogenic syncope and orthostatic hypotension who presents with passing out spells.    Since her last visit in 2022, she has experienced a worsening of her passing out spells, with over two dozen episodes and about eight major falls, including incidents where she fell down the stairs and while driving. These episodes occur daily, often when she stands up, and are not necessarily position-driven. Sitting helps alleviate the symptoms, but laying down makes her head feel heavy. She experiences these symptoms every day, particularly when standing up from a sitting position.    She has a history of gastric bypass surgery performed eight and a half years ago. Since then, she has experienced orthostatic lightheadedness and neurocardiogenic syncope. She has been on midodrine 10 mg three times daily, which she feels helps somewhat, but not significantly. She consumes about 120 ounces of water daily and eats a lot of pickles for salt intake, but notes that her symptoms remain the same regardless of salt intake. She does not drink alcohol and consumes caffeine infrequently.    She reports excessive sweating at night, soaking her sheets, which has been occurring for about a year. No recent COVID-19 infection. She has a history of fibromyalgia and is on duloxetine and Lyrica for management. She also uses Maxalt as needed for migraines.          VITALS: P- 81, BP- 116/66, SaO2- 98%  MEASUREMENTS: BMI- 32.7.  CHEST: Lungs clear to auscultation bilaterally.  CARDIOVASCULAR: Regular heart sounds, no murmurs.  EXTREMITIES: Normal pulses in left wrist, no swelling in left leg.           LABS  Total cholesterol: 81 mg/dL (16/07/9603)  Triglyceride: 83 mg/dL (54/06/8118)  HDL: 43 mg/dL (14/78/2956)  LDL: 24 mg/dL (21/30/8657)    DIAGNOSTIC  EKG: Normal sinus rhythm, 81 bpm (12/05/2023)  Echocardiogram: Normal left ventricular systolic function, EF 55%, normal left ventricular diastolic function, normal right ventricular size and EF, MOTAPC 1.9, no valve abnormalities, pulmonary artery pressure not obtained (03/14/2019)  Heart monitor: Max HR 154 bpm, Min HR 60 bpm, Avg HR 82 bpm, predominant rhythm sinus, rare supraventricular ectopy <1%, rare ventricular ectopy, no couplets, no AFib/flutter, no high-grade AV block, symptoms correlated with sinus rhythm, HR 72-128 bpm (02/05/2019)        Assessment  Orthostatic intolerance with syncope.  History of gastric bypass surgery, duodenal switch.  Obesity, improved with GLP-1 agonist.  Mental health disease including depression/anxiety and eating disorder, previously on antipsychotic therapy.  Previously on amphetamines for ADD.  Psychogenic nonepileptic seizure.  Chronic migraine without aura.  Chronic pain, fibromyalgia.  Asthma.      Plan    Neurocardiogenic Syncope  Reports worsening syncope since November 2022, with over two dozen episodes and eight major falls. Symptoms include lightheadedness, syncope, and increased falls, particularly when standing. Previous heart monitor and echocardiogram were normal. Current medications include midodrine with limited relief. Discussed potential heart rate and blood pressure issues contributing to syncope. Explained treatment options including pacemaker, ablation, and tilt table test. Discussed increasing midodrine dose, starting fludrocortisone, and using high tight compression stockings and abdominal binder.  - Order orthostatic vital signs  - Increase midodrine to 15 mg PO TID when upright  - Start fludrocortisone 0.1 mg PO QAM  - Advise potassium supplement or potassium-rich foods  to prevent hypokalemia  - Order Zio AT two-week heart monitor  - Arrange autonomic testing with tilt table test  - Recommend waist high tight compression (30-40 mmHg) stockings +/- abdominal binder  - Follow up in six months    Obesity  Lost over 100 pounds since starting Mounjaro (tirzepatide). BMI is currently 32.7. No issues with medication adherence or affordability.  - Continue Mounjaro (tirzepatide)    Fibromyalgia  On duloxetine and Lyrica for fibromyalgia.  - No changes; continue duloxetine and Lyrica    Asthma  On albuterol, fluticasone, and tiotropium inhalers. No acute issues.  - Continue albuterol, fluticasone, and tiotropium    General Health Maintenance  Blood pressure 116/66, oxygen saturation 98%, normal sinus rhythm on EKG, normal cholesterol levels, no alcohol consumption, minimal caffeine intake.  - Continue current health maintenance regimen    Follow-up  - Follow up in six months.         Current Medications (including today's revisions)   albuterol sulfate (PROAIR HFA) 90 mcg/actuation HFA aerosol inhaler Inhale two puffs by mouth into the lungs four times daily as needed for Wheezing or Shortness of Breath. Shake well before use.    calcium citrate (CALCITRATE) 950 mg tablet Take two tablets by mouth daily. 400mg  daily of elemental calcium    cariprazine (VRAYLAR) 1.5 mg cap Take one capsule by mouth twice daily.    CHOLEcalciferoL (vitamin D3) 1,000 units tablet Take two tablets by mouth daily.    cyanocobalamin (VITAMIN B-12, RUBRAMIN) 1,000 mcg/mL injection Inject 1 mL into the muscle every 30 days.    duloxetine DR (CYMBALTA) 30 mg capsule Take one capsule by mouth daily.    ERGOcalciferoL (vitamin D2) (DRISDOL) 1,250 mcg (50,000 unit) capsule Take one capsule by mouth every 7 days.    fludrocortisone (FLORINEF) 0.1 mg tablet Take one tablet by mouth every morning.    fluticasone propion-salmeteroL (ADVAIR HFA) 45-21 mcg/actuation inhaler Inhale two puffs by mouth into the lungs twice daily.    hydrOXYzine pamoate (VISTARIL) 50 mg capsule Take one capsule by mouth three times daily as needed for Itching.    levocetirizine 5 mg tab Take one tablet by mouth twice daily.    levothyroxine (SYNTHROID) 100 mcg tablet Take one tablet by mouth daily.    midodrine (PROAMATINE) 10 mg tablet Take 1.5 tablets by mouth three times daily.    pregabalin (LYRICA) 300 mg capsule Take one capsule by mouth twice daily.    rizatriptan (MAXALT) 10 mg tablet Take one tablet by mouth at onset of headache. May repeat after 2 hours. Max of 30 mg in 24 hours.    tiotropium bromide (SPIRIVA RESPIMAT) 2.5 mcg/actuation inhaler Inhale two puffs by mouth into the lungs daily.    tirzepatide (MOUNJARO) 15 mg/0.5 mL injector PEN INJECT 0.5 ML UNDER THE SKIN EVERY 7 DAYS      Vitals:    12/05/23 1032 12/05/23 1034 12/05/23 1035 12/05/23 1038   BP: 108/72 116/87 101/69 103/73   BP Source: Arm, Left Lower Arm, Left Lower Arm, Left Lower Arm, Left Lower   Pulse: 75 77 100 96   SpO2:       PainSc:       Weight:       Height:       Body mass index is 32.69 kg/m?Marland Kitchen       Past Medical History  Patient Active Problem List    Diagnosis Date Noted    Psychogenic nonepileptic seizure 08/17/2021  Chronic migraine w/o aura w/o status migrainosus, not intractable 08/17/2021    Orthostatic hypotension 02/12/2019    Neurocardiogenic syncope 01/16/2019     01/25/2019 - Holter:  Normal sinus rhythm.  Patient's triggered events did correlate with sinus rhythm and sinus tachycardia.  It was no evidence of supraventricular arrhythmia, also ventricular arrhythmias did not occur.  03/14/2019 - ECHO:  Technically difficult study; i.v. transpulmonary contrast was used to define the endocardial borders.  Normal left ventricular systolic function, estimated ejection fraction is 55 %.  Normal left ventricular diastolic function.  Right Ventricle: Normal size and ejection fraction. M-Mode TAPSE 1.90 cm (normal >1.7 cm).  There are no valvular abnormalities.  The pulmonary artery pressure could not be obtained      Obesity 01/03/2019    ADHD 01/03/2019    Anxiety 01/03/2019    Depression 01/03/2019    Chest pain 01/03/2019    Brain mass 06/06/2017    Fibromyalgia 12/30/2015       Review of Systems Constitutional: Negative.   HENT: Negative.     Eyes: Negative.    Cardiovascular: Negative.    Respiratory: Negative.     Endocrine: Negative.    Hematologic/Lymphatic: Negative.    Skin: Negative.    Musculoskeletal: Negative.    Gastrointestinal: Negative.    Genitourinary: Negative.    Neurological: Negative.    Psychiatric/Behavioral: Negative.     Allergic/Immunologic: Negative.        Cardiovascular Health Factors    Vitals  BP Readings from Last 5 Encounters:   12/05/23 103/73   07/04/23 111/85   03/16/23 (!) 133/90   09/22/22 102/68   09/07/22 109/74     Wt Readings from Last 5 Encounters:   12/05/23 97.5 kg (215 lb)   10/18/23 99.8 kg (220 lb)   07/04/23 104.6 kg (230 lb 9.6 oz)   03/16/23 108 kg (238 lb)   09/22/22 110.9 kg (244 lb 6.4 oz)     BMI Readings from Last 2 Encounters:   12/05/23 32.69 kg/m?   10/18/23 33.45 kg/m?      Smoking/Alcohol  Social History     Tobacco Use   Smoking Status Former    Current packs/day: 0.00    Types: Cigarettes    Quit date: 01/15/2009    Years since quitting: 14.8   Smokeless Tobacco Never     Alcohol Use: Not At Risk (11/02/2023)    Received from [facility]    AUDIT-C     Frequency of Alcohol Consumption: Never     Average Number of Drinks: Patient does not drink     Frequency of Binge Drinking: Never        Lipid Profile  CHOL (no units)   Date Value   07/20/2022 81   11/13/2018 54     TRIG (no units)   Date Value   07/20/2022 43   11/13/2018 71     HDL (no units)   Date Value   07/20/2022 43   11/13/2018 21     LDL (no units)   Date Value   07/20/2022 24   11/13/2018 19      Blood Glucose  GLU   Date Value   02/04/2023 84   06/29/2022 76 mg/dL     Z6X (%)   Date Value   03/16/2023 4.5   06/30/2022 4.3        Blood Counts  WBC (K/UL)   Date Value   07/04/2023 5.2   04/08/2023 4.5  HGB (GM/DL)   Date Value   16/07/9603 13.3   04/08/2023 13.3     HCT (%)   Date Value   07/04/2023 39.3   04/08/2023 39.0     PLTCT (K/UL)   Date Value   07/04/2023 223 04/08/2023 204      Metabolic Panel  Lab Results   Component Value Date    NA 141 02/04/2023    K 4.0 02/04/2023    CA 8.3 02/04/2023    CO2 28 02/04/2023    GAP 6 02/04/2023    BUN 9 02/04/2023    CR 0.75 02/04/2023    CR 0.85 06/29/2022    CR 0.85 06/29/2022    GFR 109 02/04/2023    GFRAA >60 12/21/2018    ALBUMIN 3.4 02/04/2023    TOTPROT 6.7 02/04/2023    ALKPHOS 111 02/04/2023    AST 32 02/04/2023    ALT 31 02/04/2023    TOTBILI 1.9 (H) 02/04/2023        Coagulation  No results found for: INR, PTT   Thyroid Profile  TSH (mIU/L)   Date Value   06/29/2022 3.39   12/31/2021 0.18     TSH3G (mIU/L)   Date Value   06/29/2022 3.39     FREET4R (ng/dL)   Date Value   54/06/8118 1        Cardiac Markers  No results found for: HIGHSTROPI, HSTROP0HR, HSTROP2HR, HSTROP4HR, NTPROBNP   Left Ventriclar Ejection Fraction  Lab Results   Component Value Date    ECHOEF 55 03/14/2019          I spent over 40 minutes on the date of service related to this patient's care.

## 2023-12-06 ENCOUNTER — Encounter: Admit: 2023-12-06 | Discharge: 2023-12-06 | Payer: Private Health Insurance - Indemnity | Primary: Family

## 2023-12-06 ENCOUNTER — Ambulatory Visit: Admit: 2023-12-06 | Discharge: 2023-12-06 | Payer: Private Health Insurance - Indemnity | Primary: Family

## 2023-12-06 DIAGNOSIS — E119 Type 2 diabetes mellitus without complications: Secondary | ICD-10-CM

## 2023-12-06 DIAGNOSIS — E039 Hypothyroidism, unspecified: Secondary | ICD-10-CM

## 2023-12-06 DIAGNOSIS — E559 Vitamin D deficiency, unspecified: Secondary | ICD-10-CM

## 2023-12-06 DIAGNOSIS — E063 Autoimmune thyroiditis: Secondary | ICD-10-CM

## 2023-12-06 DIAGNOSIS — E611 Iron deficiency: Secondary | ICD-10-CM

## 2023-12-06 DIAGNOSIS — E213 Hyperparathyroidism, unspecified: Secondary | ICD-10-CM

## 2023-12-06 MED ORDER — LEVOTHYROXINE 100 MCG PO TAB
100 ug | ORAL_TABLET | Freq: Every day | ORAL | 3 refills | 30.00000 days | Status: DC
Start: 2023-12-06 — End: 2023-12-06

## 2023-12-06 NOTE — Telephone Encounter
 Pt stated to call mother to schedule. LVM for mother.

## 2023-12-06 NOTE — Telephone Encounter
 Message  Received: Tennis Ship, MD  P Cvm Nurse Ep Team D  Forgot about it earlier, need to prescribe fitted high compression 30-40 mmHg waist high compression stockings

## 2023-12-07 DIAGNOSIS — Z9884 Bariatric surgery status: Secondary | ICD-10-CM

## 2023-12-09 ENCOUNTER — Encounter: Admit: 2023-12-09 | Discharge: 2023-12-09 | Payer: Private Health Insurance - Indemnity | Primary: Family

## 2023-12-09 NOTE — Telephone Encounter
 Scheduled ans with tilt table. Gave instructions and answered any questions.

## 2023-12-16 ENCOUNTER — Encounter: Admit: 2023-12-16 | Discharge: 2023-12-16 | Payer: Private Health Insurance - Indemnity | Primary: Family

## 2023-12-16 MED ORDER — MOUNJARO 15 MG/0.5 ML SC PNIJ
15 mg | SUBCUTANEOUS | 2 refills | Status: AC
Start: 2023-12-16 — End: ?

## 2024-01-04 ENCOUNTER — Encounter: Admit: 2024-01-04 | Discharge: 2024-01-04 | Payer: Private Health Insurance - Indemnity | Primary: Family

## 2024-01-06 ENCOUNTER — Encounter: Admit: 2024-01-06 | Discharge: 2024-01-06 | Primary: Family

## 2024-01-14 ENCOUNTER — Encounter: Admit: 2024-01-14 | Discharge: 2024-01-14 | Primary: Family

## 2024-01-19 ENCOUNTER — Encounter: Admit: 2024-01-19 | Discharge: 2024-01-19 | Payer: Medicare Other | Primary: Family

## 2024-01-19 ENCOUNTER — Ambulatory Visit: Admit: 2024-01-19 | Discharge: 2024-01-19 | Payer: Medicare Other | Primary: Family

## 2024-01-26 ENCOUNTER — Encounter: Admit: 2024-01-26 | Discharge: 2024-01-26 | Payer: Medicare Other | Primary: Family

## 2024-01-26 MED ORDER — RIZATRIPTAN 10 MG PO TAB
ORAL_TABLET | ORAL | 3 refills | 30.00000 days | Status: AC
Start: 2024-01-26 — End: ?

## 2024-01-26 NOTE — Telephone Encounter
 Julie Bright requested a 90 day supply of rizatriptan  for better insurance coverage. Script sent as requested.

## 2024-01-30 ENCOUNTER — Encounter: Admit: 2024-01-30 | Discharge: 2024-01-30 | Payer: Medicare Other | Primary: Family

## 2024-03-01 ENCOUNTER — Encounter: Admit: 2024-03-01 | Discharge: 2024-03-01 | Payer: MEDICARE | Primary: Family

## 2024-03-09 ENCOUNTER — Encounter: Admit: 2024-03-09 | Discharge: 2024-03-09 | Payer: MEDICARE | Primary: Family

## 2024-03-09 DIAGNOSIS — G90A POTS (postural orthostatic tachycardia syndrome): Secondary | ICD-10-CM

## 2024-03-09 DIAGNOSIS — I951 Orthostatic hypotension: Secondary | ICD-10-CM

## 2024-03-09 DIAGNOSIS — R0989 Other specified symptoms and signs involving the circulatory and respiratory systems: Secondary | ICD-10-CM

## 2024-03-09 DIAGNOSIS — R55 Syncope and collapse: Secondary | ICD-10-CM

## 2024-03-09 MED ORDER — MIDODRINE 10 MG PO TAB
15 mg | ORAL_TABLET | Freq: Three times a day (TID) | ORAL | 1 refills | 30.00000 days | Status: AC
Start: 2024-03-09 — End: ?

## 2024-03-09 MED ORDER — FLUDROCORTISONE 0.1 MG PO TAB
0.1 mg | ORAL_TABLET | Freq: Every morning | ORAL | 1 refills | 90.00000 days | Status: AC
Start: 2024-03-09 — End: ?

## 2024-03-11 ENCOUNTER — Encounter: Admit: 2024-03-11 | Discharge: 2024-03-11 | Payer: MEDICARE | Primary: Family

## 2024-04-18 ENCOUNTER — Encounter: Admit: 2024-04-18 | Discharge: 2024-04-18 | Payer: MEDICARE | Primary: Family

## 2024-05-06 ENCOUNTER — Encounter: Admit: 2024-05-06 | Discharge: 2024-05-06 | Payer: MEDICARE | Primary: Family

## 2024-05-10 ENCOUNTER — Encounter: Admit: 2024-05-10 | Discharge: 2024-05-10 | Payer: MEDICARE | Primary: Family

## 2024-05-15 ENCOUNTER — Encounter: Admit: 2024-05-15 | Discharge: 2024-05-15 | Payer: MEDICARE | Primary: Family

## 2024-05-24 ENCOUNTER — Encounter: Admit: 2024-05-24 | Discharge: 2024-05-24 | Payer: MEDICARE | Primary: Family

## 2024-05-24 DIAGNOSIS — E611 Iron deficiency: Principal | ICD-10-CM

## 2024-06-08 ENCOUNTER — Encounter: Admit: 2024-06-08 | Discharge: 2024-06-08 | Payer: MEDICARE | Primary: Family

## 2024-06-08 MED ORDER — MOUNJARO 15 MG/0.5 ML SC PNIJ
15 mg | SUBCUTANEOUS | 2 refills | 28.00000 days | Status: AC
Start: 2024-06-08 — End: ?

## 2024-06-21 ENCOUNTER — Encounter: Admit: 2024-06-21 | Discharge: 2024-06-21 | Payer: MEDICARE | Primary: Family

## 2024-06-25 ENCOUNTER — Encounter: Admit: 2024-06-25 | Discharge: 2024-06-25 | Payer: MEDICARE | Primary: Family

## 2024-08-03 ENCOUNTER — Encounter: Admit: 2024-08-03 | Discharge: 2024-08-03 | Payer: MEDICARE | Primary: Family

## 2024-08-23 ENCOUNTER — Encounter: Admit: 2024-08-23 | Discharge: 2024-08-23 | Payer: MEDICARE | Primary: Family

## 2024-10-08 ENCOUNTER — Encounter: Admit: 2024-10-08 | Discharge: 2024-10-08 | Payer: MEDICARE | Primary: Family

## 2024-10-15 ENCOUNTER — Encounter: Admit: 2024-10-15 | Discharge: 2024-10-15 | Payer: BLUE CROSS/BLUE SHIELD | Primary: Family

## 2024-10-15 DIAGNOSIS — E611 Iron deficiency: Principal | ICD-10-CM

## 2024-10-23 ENCOUNTER — Encounter: Admit: 2024-10-23 | Discharge: 2024-10-23 | Payer: BLUE CROSS/BLUE SHIELD | Primary: Family

## 2024-10-23 MED ORDER — MOUNJARO 15 MG/0.5 ML SC PNIJ
15 mg | SUBCUTANEOUS | 2 refills | 28.00000 days | Status: AC
Start: 2024-10-23 — End: ?

## 2024-10-24 ENCOUNTER — Encounter: Admit: 2024-10-24 | Discharge: 2024-10-24 | Payer: BLUE CROSS/BLUE SHIELD | Primary: Family

## 2024-10-25 ENCOUNTER — Encounter: Admit: 2024-10-25 | Discharge: 2024-10-25 | Payer: BLUE CROSS/BLUE SHIELD | Primary: Family

## 2024-10-25 ENCOUNTER — Ambulatory Visit: Admit: 2024-10-25 | Discharge: 2024-10-25 | Payer: BLUE CROSS/BLUE SHIELD | Primary: Family

## 2024-10-25 ENCOUNTER — Ambulatory Visit: Admit: 2024-10-25 | Discharge: 2024-10-26 | Payer: BLUE CROSS/BLUE SHIELD | Primary: Family

## 2024-10-25 VITALS — BP 118/79 | HR 89 | Ht 68.0 in | Wt 242.6 lb

## 2024-10-25 DIAGNOSIS — Z9884 Bariatric surgery status: Secondary | ICD-10-CM

## 2024-10-25 DIAGNOSIS — Z8601 History of colon polyps: Secondary | ICD-10-CM

## 2024-10-25 MED ORDER — SODIUM,POTASSIUM,MAG SULFATES 17.5-3.13-1.6 GRAM PO SOLR
0 refills | 30.00000 days | Status: AC
Start: 2024-10-25 — End: ?

## 2024-10-25 NOTE — Progress Notes [1]
 Date of Service: 10/25/2024    Subjective:             Julie Bright is a 34 y.o. female.    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 She achieved >100 lb weight loss and maintained glycemic control with tirzepatide  and lifestyle changes [7], [5]. 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 Her vitamin D  remained low, and she planned to restart supplementation [7]. She discontinued montelukast and B12 supplements, but continued levothyroxine , duloxetine , inhalers, and antihistamines [14]. No current edema, lymphadenopathy, or thyroid enlargement was noted [14].        Julie Bright is a 34 year old female with prior duodenal switch and gastric bypass who presents with dysphagia.    Dysphagia has persisted for several years, characterized by difficulty swallowing solid foods such as crackers, meats, and grains, with a sensation of food getting stuck in the back of her throat. She frequently regurgitates and re-chews food before swallowing again. Liquids and soups are tolerated without difficulty. Symptoms occur nearly every time she eats solid food. A prior swallow study using liquids did not reproduce her symptoms. She denies heartburn, acid reflux, chest pain, abdominal pain, nausea, or vomiting. Increased mucus production in her throat occurs when eating.    Iron deficiency anemia has been ongoing for over six years. Oral iron supplements, including slow-release formulations, are not tolerated due to severe gastrointestinal side effects. She receives intravenous iron infusions every two to three years and follows with hematology for monitoring and management. No recent blood work since 2014, aside from scopes.    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 Last upper endoscopy was in June 2021. Last colonoscopy was in 2014, at which time benign polyps were removed. She was advised to return for colonoscopy only if symptoms developed. She denies rectal bleeding, melena, or changes in stool color except when consuming colored drinks. No constipation or diarrhea, but stool frequency is increased to two to three times daily with normal consistency.    Medical history includes asthma and allergies, managed with daily allergy medication, Spiriva, and another inhaler. She also takes Maxalt  and Mounjaro . Lactose intolerance causes gastrointestinal upset and gas with milk consumption, but she does not avoid milk entirely. No other known food allergies.    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 Family history is notable for anemia (grandmother), esophageal cancer (grandfather), skin cancer (paternal grandfather), breast cancer (mother), and uterine cancer (aunt). No family history of colon cancer, colon polyps, Crohn's disease, or ulcerative colitis.    EGD performed January 2021  Normal esophagus  Evidence of gastric bypass  Large gastric pouch with intact staple line  Jejunum was normal gastric and jejunal biopsies were taken    GI note from June 2020 patient was seen for dysphagia    Pathology from August 2019  Gastric biopsies negative for H. Pylori  EGD showed normal esophagus that was empirically dilated to 18 mm with a TTS balloon  Evidence of gastric bypass  Large pouch containing a food bezoar   Staple line was intact  Jejunum was normal         Objective:  Objective    albuterol sulfate (PROAIR HFA) 90 mcg/actuation HFA aerosol inhaler Inhale two puffs by mouth into the lungs four times daily as needed for Wheezing or Shortness of Breath. Shake well before use.    calcium citrate (CALCITRATE) 950 mg tablet Take two tablets by mouth daily. 400mg  daily of elemental calcium    cariprazine (VRAYLAR) 1.5 mg cap Take one capsule by mouth twice daily.    CHOLEcalciferoL (vitamin D3) 1,000 units tablet Take two tablets by mouth daily.    cyanocobalamin (VITAMIN B-12, RUBRAMIN) 1,000 mcg/mL injection Inject 1 mL into the muscle every 30 days.    duloxetine  DR (CYMBALTA ) 30 mg capsule Take one capsule by mouth daily.    ERGOcalciferoL  (vitamin D2) (DRISDOL ) 1,250 mcg (50,000 unit) capsule Take one capsule by mouth every 7 days.    fludrocortisone  (FLORINEF ) 0.1 mg tablet Take one tablet by mouth every morning.    fluticasone propion-salmeteroL (ADVAIR HFA) 45-21 mcg/actuation inhaler Inhale two puffs by mouth into the lungs twice daily.    hydrOXYzine pamoate (VISTARIL) 50 mg capsule Take one capsule by mouth three times daily as needed for Itching.    levocetirizine 5 mg tab Take one tablet by mouth twice daily.    levothyroxine  (SYNTHROID ) 88 mcg tablet Take one tablet by mouth daily 30 minutes before breakfast. Indications: a condition with low thyroid hormone levels    midodrine  (PROAMATINE ) 10 mg tablet TAKE 1.5 TABLETS BY MOUTH THREE TIMES DAILY.    potassium chloride SR (KLOR-CON M20) 20 mEq tablet Take one tablet by mouth twice daily.    pregabalin  (LYRICA ) 300 mg capsule TAKE 1 CAPSULE BY MOUTH TWICE A DAY    rizatriptan  (MAXALT ) 10 mg tablet Take one tablet by mouth at onset of headache. May repeat after 2 hours. Max of 30 mg in 24 hours.    sodium,potassium,mag sulfates (SUPREP BOWEL PREP KIT) 17.5-3.13-1.6 gram oral solution Evening prior: mix 1 bottle with 16 ounces of water and drink orally, followed by 32 additional ounces of water.  Day of: mix 2nd bottle and drink orally, followed by 32 ounces of water. Complete the bowel preparation at least 2 hrs before procedure.    tiotropium bromide (SPIRIVA RESPIMAT) 2.5 mcg/actuation inhaler Inhale two puffs by mouth into the lungs daily.    [START ON 10/28/2024] tirzepatide  (MOUNJARO ) 15 mg/0.5 mL injector PEN Inject 0.5 mL under the skin every Sunday.     Vitals:    10/25/24 0959   BP: 118/79   BP Source: Arm, Left Lower   Pulse: 89   PainSc: Zero   Weight: 110 kg (242 lb 9.6 oz)   Height: 172.7 cm (5' 8)     Body mass index is 36.89 kg/m?SABRA   Physical Exam  Pt alert and oriented, vital signs reviewed, patient afebrile  No jaundice, scleral icterus  No rashes  HEENT normal, PERRLA  Lungs clear bilaterally  Heart Regular  Abdomen soft, non-tender, non-distended, no rebound or guarding, normal bowel sounds  No edema, adequate pulses bilaterally  Neurologic exam is non-focal  No lymphadenopathy            Assessment and Plan:             Jaylanie M. Madura was seen today for new patient.    Diagnoses and all orders for this visit:    Oropharyngeal dysphagia  -     AMB REFERRAL TO SPEECH THERAPY  -     SWALLOW MOTION SERIES; Future; Expected date:  10/26/2024    H/O gastric bypass    Iron deficiency anemia, unspecified iron deficiency anemia type  -     sodium,potassium,mag sulfates (SUPREP BOWEL PREP KIT) 17.5-3.13-1.6 gram oral solution; Evening prior: mix 1 bottle with 16 ounces of water and drink orally, followed by 32 additional ounces of water.  Day of: mix 2nd bottle and drink orally, followed by 32 ounces of water. Complete the bowel preparation at least 2 hrs before procedure.    History of colon polyps        Dysphagia, r/o eosinophilic esophagitis given history of asthma and allergies  Chronic dysphagia with solids, localized to upper esophagus, possibly eosinophilic esophagitis due to asthma and allergies. Prior dilation ineffective, no reflux symptoms.  - Ordered upper endoscopy (EGD) with esophageal biopsies to evaluate for eosinophilic esophagitis and other dysphagia causes.  - Recommended swallow motion series with speech therapy to assess swallowing mechanics and identify abnormalities.    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 Exclude GI blood loss.  - Confirmed hematology follow-up for iron monitoring and IV iron infusions.  - Ordered EGD and colonoscopy to evaluate for GI blood loss, given chronic iron deficiency and >10 years since last colonoscopy.    History of colonic polyps  Remote colonic polyps, no current GI bleeding or symptoms, no family history of colorectal cancer. Surveillance needed due to >10 years since last colonoscopy.  - Ordered colonoscopy for surveillance of colonic polyps and assessment for new lesions.     Schedule EGD and colonoscopy - biopsy for EoE, [facility], Sup[facility] -- Endoscopy scheduling will contact you. If you have not heard from them in one week, please call [phone]. Pre-procedure instructions are included below.  Swallow motion series, Speech therapy consult -- to schedule, please call [phone]  Consider esophageal manometry if other testing is unrevealing  Continue follow up with Hematology about iron deficiency     A total of 45 minutes were spent today on this encounter.  This includes preparing for the visit, reviewing medical records, obtaining relevant history, speaking to the patient, interpreting test results, ordering medications and tests and documenting the visit in the electronic health record.

## 2024-10-26 DIAGNOSIS — D509 Iron deficiency anemia, unspecified: Principal | ICD-10-CM

## 2024-10-26 DIAGNOSIS — R1312 Dysphagia, oropharyngeal phase: Secondary | ICD-10-CM

## 2024-10-28 ENCOUNTER — Encounter: Admit: 2024-10-28 | Discharge: 2024-10-28 | Payer: BLUE CROSS/BLUE SHIELD | Primary: Family

## 2024-11-01 ENCOUNTER — Encounter: Admit: 2024-11-01 | Discharge: 2024-11-01 | Payer: BLUE CROSS/BLUE SHIELD | Primary: Family

## 2024-11-01 ENCOUNTER — Ambulatory Visit: Admit: 2024-11-01 | Discharge: 2024-11-02 | Payer: BLUE CROSS/BLUE SHIELD | Primary: Family

## 2024-11-01 VITALS — BP 131/81 | HR 97 | Temp 98.10000°F | Ht 67.992 in | Wt 259.7 lb

## 2024-11-01 DIAGNOSIS — M8508 Fibrous dysplasia (monostotic), other site: Secondary | ICD-10-CM

## 2024-11-01 DIAGNOSIS — M797 Fibromyalgia: Secondary | ICD-10-CM

## 2024-11-01 DIAGNOSIS — G43709 Chronic migraine without aura, not intractable, without status migrainosus: Secondary | ICD-10-CM

## 2024-11-01 DIAGNOSIS — F445 Conversion disorder with seizures or convulsions: Principal | ICD-10-CM

## 2024-11-01 MED ORDER — DULOXETINE 30 MG PO CPDR
30 mg | ORAL_CAPSULE | Freq: Every day | ORAL | 11 refills | 60.00000 days | Status: AC
Start: 2024-11-01 — End: ?

## 2024-11-01 MED ORDER — RIZATRIPTAN 10 MG PO TAB
ORAL_TABLET | ORAL | 3 refills | 30.00000 days | Status: AC
Start: 2024-11-01 — End: ?

## 2024-11-01 NOTE — Progress Notes [1]
 Date of Service: 11/01/2024    Subjective:       Chief Complaint:   Chief Complaint   Patient presents with    Follow Up     Migraine headaches, PNES, fibromyalgia         Julie Bright is a 34 year old woman who is evaluated in follow-up regarding the above.  Her mother, Baylee Campus, accompanied her for her initial consultation in August 2020 as well as prior visit, and helped relate the history.  In 2016, the patient began to experience burning quality pain in the left maxillary region which can last several hours in duration.  Overall, this has improved with medication.     She has had episodes of tremulousness and slurred speech.  She underwent extensive work-up under the care of Dr. Renay Haddock, epileptologist at [facility].  Prior to that, she had been seen by Dr. Marcey Mac.  Initial EEG was interpreted as markedly abnormal with frequent theta frequency slowing admixed delta and theta frequency slowing, most often over the left hemisphere laterally.  At times this appeared to be generalized with a bifrontal dominance.  Occasional focal sharp waves were noted bitemporally.  She was placed on levetiracetam, but developed dyspnea, so transitioned to lacosamide.  Her episodes continue to occur.  Lyrica  which had been prescribed for fibromyalgia has been of some benefit.  She underwent CCTV-EEG monitoring in early 2019 with similar findings, but no clear seizures identified throughout the study.  It was felt that her episodes were likely nonepileptic.     She has had 1-2 migraines per month.  Triptans have been helpful in the past.  She also notes that her migraines tend to be more frequent if she forgets to take her allergy medications.      In the past, she has tried carbamazepine, gabapentin.  She has also been on Depakote 250 mg twice daily along with Lyrica  100 mg 3 times daily.  The latter 2 medications have helped her fibromyalgia and migraines.  Over the last several years she has remained on Lyrica  300 mg twice daily along with duloxetine  30 mg twice daily.  This has also worked quite nicely.     When I first met her in August 2020, records from [facility] were reviewed.  MRI brain from July 2018 demonstrated 1.5 x 1.4 x 0.6 cm homogeneously enhancing mass along the anterior glenoid process of the left sphenoid bone.  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  At the time of her initial consultation in August 2020 we discussed her psychogenic nonepileptic events in detail.  Since these mainly seem to be stress-induced, I encouraged her to work with a therapist using the [place] therapist/patient workbooks to see if this was helpful.  We also talked about the possibly going to another comprehensive epilepsy center, but she and her mother had decided against this.  When I saw her in February 2022, she was undergoing psychotherapy with Dr. Nickola and reported fewer psychogenic nonepileptic events.  As long as she is undergoing psychotherapy, these episodes are markedly diminished.  When I saw her in November 2022, she reported that she had also had some issues with stealing which is one of the reasons that she was back working with Dr. Nickola.  She is still pursuing all of the techniques and exercises she was taught at home, but isn't currently in psychotherapy. Since I last evaluated her in January 2025, she had really been doing well.  She  has not had any psychogenic seizures, episodes of stealing, or depression or anxiety.  Her weight has also diminished.  She continues to do well, weight down to 250 pounds, thought increased a bit over the last year.  She feels that she is doing well.        Past Medical History:    Acid reflux    ADHD    ADHD (attention deficit hyperactivity disorder)    Anxiety    Anxiety    Asthma    Bipolar 1 disorder (CMS-HCC)    Chest pain    Depression    Depression    Dizziness    Dyspnea    Generalized headaches    Hypothyroidism    Intellectual disability    Migraines    Narcolepsy    Narcolepsy and cataplexy    Obesity    PCOS (polycystic ovarian syndrome)    Syncope and collapse    Thyroid disorder    Trigeminal neuralgia of left side of face    Unspecified deficiency anemia            Social Drivers of Health     Tobacco Use: Medium Risk (11/01/2024)    Patient History     Smoking Tobacco Use: Former     Smokeless Tobacco Use: Never     Passive Exposure: Not on file   Alcohol Use: Not At Risk (11/02/2023)    Received from [facility]    AUDIT-C     Q3: How often do you have six or more drinks on one occasion?: Never     Q2: How many drinks containing alcohol do you have on a typical day when you are drinking?: Patient does not drink     Q1: How often do you have a drink containing alcohol?: Never   Financial Resource Strain: High Risk (11/02/2023)    Received from [facility]    Overall Financial Resource Strain (CARDIA)     Difficulty of Paying Living Expenses: Hard   Food Insecurity: Food Insecurity Present (11/02/2023)    Received from [facility]    Food Insecurity     Within the past 12 months, the food you bought just didn't last and you didn't have money to get more.: Sometimes true     Within the past 12 months, you worried that your food would run out before you got the money to buy more.: Sometimes true   Transportation Needs: Not At Risk (11/02/2023)    Received from [facility]    [facility] - Transportation     Lack of Transportation: No   Stress: No Stress Concern Present (11/02/2023)    Received from [facility]    [facility] - Occupational Stress Questionnaire     Feeling of Stress : Only a little   Social Connections: Not At Risk (11/02/2023)    Received from [facility]    [facility] - Social Connections     Frequency of Feeling Lonely or Isolated: Never     Needs Help with Day-to-Day Activities: I don't need any help   Health Literacy: Adequate Health Literacy (11/02/2023)    Received from [facility]    B1300 Health Literacy     Frequency of need for help with medical instructions: Never   Depression: Not at risk (11/01/2024)    PHQ-2     PHQ-2 Score: 0   Housing Stability: Not At Risk (11/02/2023)  Received from [facility]    NCSS - Housing/Utilities     Unable to Get Utilities: No     Worried About Losing Housing: No     Has Housing: Yes         Family History   Problem Relation Name Age of Onset    Paroxysmal SVT Mother Misty     Diabetes Mother Misty     Hyperlipidemia Mother Misty     Tremor Mother Misty     Thyroid Disease Maternal Grandmother Ronal Kung     Hyperlipidemia Maternal Grandmother Ronal Kung     Hyperlipidemia Maternal Grandfather Jerel     Hypertension Maternal Grandfather Terry     Thyroid Disease Paternal Great-Grandmother      Cancer-Thyroid Maternal Aunt Ensign     Migraines Other Roselind Cheng Aunt    Multiple sclerosis Other Tilton Cheng Cousin    Thyroid Disease Maternal Aunt Kylee        Review of Systems      Objective:         Allergies[1]     albuterol sulfate (PROAIR HFA) 90 mcg/actuation HFA aerosol inhaler Inhale two puffs by mouth into the lungs four times daily as needed for Wheezing or Shortness of Breath. Shake well before use.    calcium citrate (CALCITRATE) 950 mg tablet Take two tablets by mouth daily. 400mg  daily of elemental calcium    cariprazine (VRAYLAR) 1.5 mg cap Take one capsule by mouth twice daily.    CHOLEcalciferoL (vitamin D3) 1,000 units tablet Take two tablets by mouth daily.    cyanocobalamin (VITAMIN B-12, RUBRAMIN) 1,000 mcg/mL injection Inject 1 mL into the muscle every 30 days.    duloxetine  DR (CYMBALTA ) 30 mg capsule Take one capsule by mouth daily.    ERGOcalciferoL  (vitamin D2) (DRISDOL ) 1,250 mcg (50,000 unit) capsule Take one capsule by mouth every 7 days.    fludrocortisone  (FLORINEF ) 0.1 mg tablet Take one tablet by mouth every morning.    fluticasone propion-salmeteroL (ADVAIR HFA) 45-21 mcg/actuation inhaler Inhale two puffs by mouth into the lungs twice daily.    hydrOXYzine pamoate (VISTARIL) 50 mg capsule Take one capsule by mouth three times daily as needed for Itching.    levocetirizine 5 mg tab Take one tablet by mouth twice daily.    levothyroxine  (SYNTHROID ) 88 mcg tablet Take one tablet by mouth daily 30 minutes before breakfast. Indications: a condition with low thyroid hormone levels    midodrine  (PROAMATINE ) 10 mg tablet TAKE 1.5 TABLETS BY MOUTH THREE TIMES DAILY.    potassium chloride SR (KLOR-CON M20) 20 mEq tablet Take one tablet by mouth twice daily.    pregabalin  (LYRICA ) 300 mg capsule TAKE 1 CAPSULE BY MOUTH TWICE A DAY    rizatriptan  (MAXALT ) 10 mg tablet Take one tablet by mouth at onset of headache. May repeat after 2 hours. Max of 30 mg in 24 hours.    sodium,potassium,mag sulfates (SUPREP BOWEL PREP KIT) 17.5-3.13-1.6 gram oral solution Evening prior: mix 1 bottle with 16 ounces of water and drink orally, followed by 32 additional ounces of water.  Day of: mix 2nd bottle and drink orally, followed by 32 ounces of water. Complete the bowel preparation at least 2 hrs before procedure.    tiotropium bromide (SPIRIVA RESPIMAT) 2.5 mcg/actuation inhaler Inhale two puffs by mouth into the lungs daily.    tirzepatide  (MOUNJARO ) 15 mg/0.5  mL injector PEN Inject 0.5 mL under the skin every Sunday.     Vitals:    11/01/24 0833   BP: 131/81   BP Source: Arm, Right Lower   Pulse: 97   Temp: 36.7 ?C (98.1 ?F)   SpO2: 98%   TempSrc: Temporal   PainSc: Zero   Weight: 117.8 kg (259 lb 11.2 oz)   Height: 172.7 cm (5' 7.99)     Body mass index is 39.5 kg/m?SABRA     Physical Exam    Office Procedure:           Assessment and Plan:    1. Psychogenic nonepileptic seizure    2. Chronic migraine w/o aura w/o status migrainosus, not intractable    3. Fibromyalgia    4. Fibrous dysplasia (monostotic), other site        Psychogenic nonepileptic events, overall markedly improved.  She has not had any episodes for at least 3 years now.  She will continue to pursue the cognitive therapy techniques that have helped her so much.    Migraine headaches, reasonably well-controlled.  Continue current medication regimen.  Rizatriptan  was refilled today.    Fibromyalgia, overall improved and reasonably well-controlled.  Continue duloxetine .    I will see her again for follow-up in 1 year from now.    Total time today was 32 minutes in the following activities: preparing to see the patient, reviewing records, performing a medically appropriate examination, counseling and educating the patient, ordering tests, medications, and treatment, documenting critical information in the EHR, referring and communicating with other healthcare professionals (when not separately reported), and coordinating care.                    [1]   Allergies  Allergen Reactions    Bacitracin NAUSEA AND VOMITING, ITCHING and RASH    Bactrim [Sulfamethoxazole-Trimethoprim] NAUSEA ONLY and RASH    Ofloxacin EDEMA     Bad infection in eyes    Sulfa (Sulfonamide Antibiotics) RASH     hives  hives      Trimethoprim RASH    Bacitracin UNKNOWN    Hydrocodone-Aspirin HEADACHE and UNKNOWN    Ketotifen Fumarate UNKNOWN     Caused an eye infection.   Caused an eye infection.       Lactase STOMACH UPSET    Lortab [Hydrocodone-Acetaminophen] UNKNOWN    Meloxicam UNKNOWN    Metaxalone NAUSEA ONLY    Neurontin [Gabapentin] UNKNOWN    Oxycodone UNKNOWN    Tramadol UNKNOWN

## 2024-11-07 ENCOUNTER — Encounter: Admit: 2024-11-07 | Discharge: 2024-11-07 | Payer: PRIVATE HEALTH INSURANCE | Primary: Family

## 2024-11-09 ENCOUNTER — Encounter: Admit: 2024-11-09 | Discharge: 2024-11-09 | Payer: PRIVATE HEALTH INSURANCE | Primary: Family

## 2024-11-09 ENCOUNTER — Ambulatory Visit: Admit: 2024-11-09 | Discharge: 2024-11-10 | Payer: PRIVATE HEALTH INSURANCE | Primary: Family

## 2024-11-09 ENCOUNTER — Ambulatory Visit: Admit: 2024-11-09 | Discharge: 2024-11-09 | Payer: PRIVATE HEALTH INSURANCE | Primary: Family

## 2024-11-09 DIAGNOSIS — M65312 Trigger thumb, left thumb: Secondary | ICD-10-CM

## 2024-11-09 DIAGNOSIS — M79642 Pain in left hand: Principal | ICD-10-CM

## 2024-11-09 MED ORDER — LIDOCAINE (PF) 10 MG/ML (1 %) IJ SOLN
1 mL | Freq: Once | INTRAMUSCULAR | 0 refills | Status: CP | PRN
Start: 2024-11-09 — End: ?

## 2024-11-09 MED ORDER — NAPROXEN 500 MG PO TAB
500 mg | ORAL_TABLET | Freq: Two times a day (BID) | ORAL | 0 refills | 30.00000 days | Status: AC
Start: 2024-11-09 — End: ?

## 2024-11-09 MED ORDER — TRIAMCINOLONE ACETONIDE 40 MG/ML IJ SUSP
40 mg | Freq: Once | INTRAMUSCULAR | 0 refills | Status: CP | PRN
Start: 2024-11-09 — End: ?

## 2024-11-09 NOTE — Progress Notes [1]
 [facility]      Date of Service: 11/09/2024    Subjective:           Left thumb pain    History of Present Illness  This is a 34 year old female with a history of fibromyalgia who is presenting with a month-long history of left thumb pain.  There was no injury that she can recall.  She reports pain when she tries to bend her thumb she localizes pain symptoms towards the A1 pulley of her thumb.  She denies any clicking symptoms.  She denies any loss of motion or sensory disturbances.  Julie Bright is a 34 y.o. female.     Review of Systems      Objective:          albuterol sulfate (PROAIR HFA) 90 mcg/actuation HFA aerosol inhaler Inhale two puffs by mouth into the lungs four times daily as needed for Wheezing or Shortness of Breath. Shake well before use.    calcium citrate (CALCITRATE) 950 mg tablet Take two tablets by mouth daily. 400mg  daily of elemental calcium    cariprazine (VRAYLAR) 1.5 mg cap Take one capsule by mouth twice daily.    CHOLEcalciferoL (vitamin D3) 1,000 units tablet Take two tablets by mouth daily.    cyanocobalamin (VITAMIN B-12, RUBRAMIN) 1,000 mcg/mL injection Inject 1 mL into the muscle every 30 days.    duloxetine  DR (CYMBALTA ) 30 mg capsule Take one capsule by mouth daily.    ERGOcalciferoL  (vitamin D2) (DRISDOL ) 1,250 mcg (50,000 unit) capsule Take one capsule by mouth every 7 days.    fludrocortisone  (FLORINEF ) 0.1 mg tablet Take one tablet by mouth every morning.    fluticasone propion-salmeteroL (ADVAIR HFA) 45-21 mcg/actuation inhaler Inhale two puffs by mouth into the lungs twice daily.    hydrOXYzine pamoate (VISTARIL) 50 mg capsule Take one capsule by mouth three times daily as needed for Itching.    levocetirizine 5 mg tab Take one tablet by mouth twice daily.    levothyroxine  (SYNTHROID ) 88 mcg tablet Take one tablet by mouth daily 30 minutes before breakfast. Indications: a condition with low thyroid hormone levels    midodrine  (PROAMATINE ) 10 mg tablet TAKE 1.5 TABLETS BY MOUTH THREE TIMES DAILY.    potassium chloride SR (KLOR-CON M20) 20 mEq tablet Take one tablet by mouth twice daily.    pregabalin  (LYRICA ) 300 mg capsule TAKE 1 CAPSULE BY MOUTH TWICE A DAY    rizatriptan  (MAXALT ) 10 mg tablet Take one tablet by mouth at onset of headache. May repeat after 2 hours. Max of 30 mg in 24 hours.    sodium,potassium,mag sulfates (SUPREP BOWEL PREP KIT) 17.5-3.13-1.6 gram oral solution Evening prior: mix 1 bottle with 16 ounces of water and drink orally, followed by 32 additional ounces of water.  Day of: mix 2nd bottle and drink orally, followed by 32 ounces of water. Complete the bowel preparation at least 2 hrs before procedure.    tiotropium bromide (SPIRIVA RESPIMAT) 2.5 mcg/actuation inhaler Inhale two puffs by mouth into the lungs daily.    tirzepatide  (MOUNJARO ) 15 mg/0.5 mL injector PEN Inject 0.5 mL under the skin every Sunday.     Vitals:    11/09/24 1134   PainSc: Seven   Weight: 117.5 kg (259 lb)   Height: 172.7 cm (5' 8)     Body mass index is 39.38 kg/m?Julie Bright     Physical Exam  Constitutional:       General:  She is not in acute distress.     Appearance: Normal appearance.   Neurological:      Mental Status: She is oriented to person, place, and time.   Psychiatric:         Behavior: Behavior normal.       Ortho Exam  Left hand: The skin is intact, there are no wounds or scars.  She has full range of motion of the wrist and digits.  There is some tenderness over the A1 pulley of her thumb and she does have some pain when she flexes her thumb at the IP joint without frank triggering.  She has no other areas of tenderness.  She has intact sensation and brisk cap refill to all digits.       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  I would recommend trying some conservative care with a steroid injection as well as some oral anti-inflammatories.  I welcome follow-up in 1 month if she is not getting better.    Tendon Injection  Location: Thumb - L thumb flexor sheath    Consent:   Consent obtained: verbal  Consent given by: patient  Risks discussed: Bleeding, Damage to surrounding structures, Infection and Pain  Alternatives discussed: delayed treatment, alternative treatment, no treatment and referral     Universal Protocol:  Relevant documents: relevant documents present and verified  Patient identity confirmed: Patient identify confirmed verbally with patient.          Procedures Details:        JOINT LOCATION: Thumb  SITE: L thumb flexor       Indications: pain   Prep: alcohol  Approach: volar  Medications administered: 40 mg triamcinolone  acetonide 40 mg/mL; 1 mL lidocaine  PF 1% (10 mg/mL)  Patient tolerance: Patient tolerated the procedure well with no immediate complications. Pressure was applied, and hemostasis was accomplished.                                               Donnice Boehringer, MD  Associate Professor  Orthopedic Hand Surgery

## 2024-11-09 NOTE — Patient Instructions [37]
 It was a pleasure seeing you today. Please contact us if you have any further questions, we are happy to assist.     For nursing/medical/casting or splinting questions, please send a message through MyChart. This is our preferred method of contact and the most efficient way to contact your healthcare team. If you do not have internet access/smartphone you can call my Clinical Nurse Coordinator at [phone]. Please do not leave multiple voicemail's for [place]. Leaving multiple voicemail's delays [place] getting back to you quicker.    NOTE: MyChart messages and phone calls received on weekends, on holidays, and after 4 pm on weekdays will NOT be seen until the following business day.     - For medication refills, please ask your pharmacy to send an electronic request.  Please allow at least 3 business days for medication refills.    - To cancel, change, or schedule a clinic appointment: Call  Scheduling at [phone].  - For any radiology concerns or scheduling, please call [phone].     Any insurance, disability or Family Medical Leave Act [facility]) forms can be faxed to [phone].  Be sure to include your name and date of birth on the form. We will complete and fax the forms where they need to go as soon as we are able. For any questions about disability or FMLA paperwork, please contact Dr. Darden Dates administrative assistant, Karna Christmas at [phone].    Do not hesitate to contact our office with any further questions.Thank you and have a great day!    Philomena Course, MD   [facility]   Orthopedic & Sports Medicine  Office: [phone]  Fax: [phone]

## 2024-11-15 ENCOUNTER — Encounter: Admit: 2024-11-15 | Discharge: 2024-11-15 | Payer: PRIVATE HEALTH INSURANCE | Primary: Family
